# Patient Record
Sex: Male | Born: 1950 | Race: White | Hispanic: No | Smoking: Never smoker
Health system: Southern US, Community
[De-identification: ages and names within clinical notes are randomized; demographics above are authoritative.]

## PROBLEM LIST (undated history)

## (undated) DIAGNOSIS — G709 Myoneural disorder, unspecified: Secondary | ICD-10-CM

## (undated) DIAGNOSIS — E119 Type 2 diabetes mellitus without complications: Secondary | ICD-10-CM

## (undated) DIAGNOSIS — Z87442 Personal history of urinary calculi: Secondary | ICD-10-CM

## (undated) DIAGNOSIS — R350 Frequency of micturition: Secondary | ICD-10-CM

## (undated) DIAGNOSIS — E291 Testicular hypofunction: Secondary | ICD-10-CM

## (undated) DIAGNOSIS — I214 Non-ST elevation (NSTEMI) myocardial infarction: Secondary | ICD-10-CM

## (undated) DIAGNOSIS — N400 Enlarged prostate without lower urinary tract symptoms: Secondary | ICD-10-CM

## (undated) DIAGNOSIS — R300 Dysuria: Secondary | ICD-10-CM

## (undated) DIAGNOSIS — I10 Essential (primary) hypertension: Secondary | ICD-10-CM

## (undated) DIAGNOSIS — I255 Ischemic cardiomyopathy: Secondary | ICD-10-CM

## (undated) DIAGNOSIS — IMO0002 Reserved for concepts with insufficient information to code with codable children: Secondary | ICD-10-CM

## (undated) DIAGNOSIS — E785 Hyperlipidemia, unspecified: Secondary | ICD-10-CM

## (undated) DIAGNOSIS — N419 Inflammatory disease of prostate, unspecified: Secondary | ICD-10-CM

## (undated) DIAGNOSIS — G894 Chronic pain syndrome: Secondary | ICD-10-CM

## (undated) DIAGNOSIS — S4990XA Unspecified injury of shoulder and upper arm, unspecified arm, initial encounter: Secondary | ICD-10-CM

## (undated) DIAGNOSIS — E663 Overweight: Secondary | ICD-10-CM

## (undated) DIAGNOSIS — N1330 Unspecified hydronephrosis: Secondary | ICD-10-CM

## (undated) DIAGNOSIS — G4700 Insomnia, unspecified: Secondary | ICD-10-CM

## (undated) DIAGNOSIS — K5790 Diverticulosis of intestine, part unspecified, without perforation or abscess without bleeding: Secondary | ICD-10-CM

## (undated) DIAGNOSIS — Z8739 Personal history of other diseases of the musculoskeletal system and connective tissue: Secondary | ICD-10-CM

## (undated) DIAGNOSIS — E039 Hypothyroidism, unspecified: Secondary | ICD-10-CM

## (undated) DIAGNOSIS — I77810 Thoracic aortic ectasia: Secondary | ICD-10-CM

## (undated) DIAGNOSIS — Z972 Presence of dental prosthetic device (complete) (partial): Secondary | ICD-10-CM

## (undated) DIAGNOSIS — K289 Gastrojejunal ulcer, unspecified as acute or chronic, without hemorrhage or perforation: Secondary | ICD-10-CM

## (undated) DIAGNOSIS — R351 Nocturia: Secondary | ICD-10-CM

## (undated) DIAGNOSIS — G2581 Restless legs syndrome: Secondary | ICD-10-CM

## (undated) DIAGNOSIS — I2510 Atherosclerotic heart disease of native coronary artery without angina pectoris: Secondary | ICD-10-CM

## (undated) HISTORY — PX: APPENDECTOMY: SHX54

## (undated) HISTORY — DX: Personal history of other diseases of the musculoskeletal system and connective tissue: Z87.39

## (undated) HISTORY — PX: STOMACH SURGERY: SHX791

## (undated) HISTORY — DX: Atherosclerotic heart disease of native coronary artery without angina pectoris: I25.10

## (undated) HISTORY — PX: VASECTOMY: SHX75

## (undated) HISTORY — DX: Hypothyroidism, unspecified: E03.9

## (undated) HISTORY — DX: Testicular hypofunction: E29.1

## (undated) HISTORY — DX: Reserved for concepts with insufficient information to code with codable children: IMO0002

## (undated) HISTORY — DX: Ischemic cardiomyopathy: I25.5

## (undated) HISTORY — DX: Inflammatory disease of prostate, unspecified: N41.9

## (undated) HISTORY — DX: Frequency of micturition: R35.0

## (undated) HISTORY — DX: Dysuria: R30.0

## (undated) HISTORY — DX: Thoracic aortic ectasia: I77.810

## (undated) HISTORY — PX: HEMORRHOID SURGERY: SHX153

## (undated) HISTORY — DX: Essential (primary) hypertension: I10

## (undated) HISTORY — DX: Overweight: E66.3

## (undated) HISTORY — DX: Gastrojejunal ulcer, unspecified as acute or chronic, without hemorrhage or perforation: K28.9

## (undated) HISTORY — PX: LITHOTRIPSY: SUR834

## (undated) HISTORY — PX: NASAL SINUS SURGERY: SHX719

## (undated) HISTORY — DX: Myoneural disorder, unspecified: G70.9

## (undated) HISTORY — DX: Nocturia: R35.1

## (undated) HISTORY — DX: Unspecified hydronephrosis: N13.30

## (undated) HISTORY — DX: Diverticulosis of intestine, part unspecified, without perforation or abscess without bleeding: K57.90

## (undated) HISTORY — DX: Benign prostatic hyperplasia without lower urinary tract symptoms: N40.0

## (undated) HISTORY — DX: Personal history of urinary calculi: Z87.442

## (undated) HISTORY — DX: Hyperlipidemia, unspecified: E78.5

## (undated) HISTORY — DX: Insomnia, unspecified: G47.00

---

## 2004-03-16 ENCOUNTER — Ambulatory Visit: Payer: Self-pay | Admitting: Urology

## 2004-03-23 ENCOUNTER — Ambulatory Visit: Payer: Self-pay | Admitting: Urology

## 2005-03-01 ENCOUNTER — Ambulatory Visit: Payer: Self-pay | Admitting: Urology

## 2005-12-10 ENCOUNTER — Ambulatory Visit: Payer: Self-pay | Admitting: Unknown Physician Specialty

## 2006-11-05 ENCOUNTER — Ambulatory Visit: Payer: Self-pay | Admitting: General Surgery

## 2006-11-05 ENCOUNTER — Other Ambulatory Visit: Payer: Self-pay

## 2006-11-08 ENCOUNTER — Ambulatory Visit: Payer: Self-pay | Admitting: General Surgery

## 2007-08-25 ENCOUNTER — Ambulatory Visit: Payer: Self-pay | Admitting: Unknown Physician Specialty

## 2007-11-10 ENCOUNTER — Ambulatory Visit: Payer: Self-pay | Admitting: Urology

## 2008-08-11 ENCOUNTER — Ambulatory Visit: Payer: Self-pay | Admitting: Unknown Physician Specialty

## 2008-08-18 ENCOUNTER — Ambulatory Visit: Payer: Self-pay | Admitting: Unknown Physician Specialty

## 2008-08-27 ENCOUNTER — Ambulatory Visit: Payer: Self-pay | Admitting: Unknown Physician Specialty

## 2008-12-27 ENCOUNTER — Ambulatory Visit: Payer: Self-pay | Admitting: Pain Medicine

## 2008-12-29 ENCOUNTER — Ambulatory Visit: Payer: Self-pay | Admitting: Unknown Physician Specialty

## 2009-02-02 ENCOUNTER — Ambulatory Visit: Payer: Self-pay | Admitting: Pain Medicine

## 2009-02-03 ENCOUNTER — Ambulatory Visit: Payer: Self-pay | Admitting: Family Medicine

## 2009-02-28 ENCOUNTER — Ambulatory Visit: Payer: Self-pay | Admitting: Pain Medicine

## 2009-03-30 ENCOUNTER — Ambulatory Visit: Payer: Self-pay | Admitting: Pain Medicine

## 2009-06-02 ENCOUNTER — Ambulatory Visit: Payer: Self-pay | Admitting: Physician Assistant

## 2009-08-29 ENCOUNTER — Ambulatory Visit: Payer: Self-pay | Admitting: Pain Medicine

## 2009-11-22 ENCOUNTER — Ambulatory Visit: Payer: Self-pay | Admitting: Urology

## 2011-03-12 DIAGNOSIS — R109 Unspecified abdominal pain: Secondary | ICD-10-CM | POA: Insufficient documentation

## 2011-09-07 DIAGNOSIS — G894 Chronic pain syndrome: Secondary | ICD-10-CM | POA: Insufficient documentation

## 2012-04-09 ENCOUNTER — Ambulatory Visit: Payer: Self-pay | Admitting: Internal Medicine

## 2012-06-04 ENCOUNTER — Emergency Department: Payer: Self-pay | Admitting: Emergency Medicine

## 2012-06-04 LAB — CBC
HCT: 41.7 % (ref 40.0–52.0)
MCH: 29.3 pg (ref 26.0–34.0)
MCV: 88 fL (ref 80–100)
Platelet: 187 10*3/uL (ref 150–440)
RBC: 4.74 10*6/uL (ref 4.40–5.90)
RDW: 14.9 % — ABNORMAL HIGH (ref 11.5–14.5)
WBC: 8.3 10*3/uL (ref 3.8–10.6)

## 2012-06-04 LAB — URINALYSIS, COMPLETE
Ketone: NEGATIVE
Leukocyte Esterase: NEGATIVE
Protein: NEGATIVE
RBC,UR: 12 /HPF (ref 0–5)
Squamous Epithelial: NONE SEEN

## 2012-06-04 LAB — BASIC METABOLIC PANEL
Anion Gap: 9 (ref 7–16)
BUN: 28 mg/dL — ABNORMAL HIGH (ref 7–18)
Chloride: 102 mmol/L (ref 98–107)
Co2: 24 mmol/L (ref 21–32)
Creatinine: 1.84 mg/dL — ABNORMAL HIGH (ref 0.60–1.30)
EGFR (African American): 44 — ABNORMAL LOW
EGFR (Non-African Amer.): 38 — ABNORMAL LOW
Glucose: 105 mg/dL — ABNORMAL HIGH (ref 65–99)
Osmolality: 276 (ref 275–301)
Potassium: 4.3 mmol/L (ref 3.5–5.1)
Sodium: 135 mmol/L — ABNORMAL LOW (ref 136–145)

## 2012-06-09 ENCOUNTER — Ambulatory Visit: Payer: Self-pay

## 2012-06-11 ENCOUNTER — Ambulatory Visit: Payer: Self-pay | Admitting: Urology

## 2012-06-12 ENCOUNTER — Ambulatory Visit: Payer: Self-pay | Admitting: Urology

## 2012-06-24 ENCOUNTER — Ambulatory Visit: Payer: Self-pay

## 2012-07-10 ENCOUNTER — Ambulatory Visit: Payer: Self-pay

## 2013-03-19 ENCOUNTER — Ambulatory Visit: Payer: Self-pay | Admitting: Unknown Physician Specialty

## 2013-03-20 LAB — PATHOLOGY REPORT

## 2013-04-10 DIAGNOSIS — G629 Polyneuropathy, unspecified: Secondary | ICD-10-CM | POA: Insufficient documentation

## 2013-09-09 ENCOUNTER — Encounter: Payer: Self-pay | Admitting: Podiatry

## 2013-09-09 ENCOUNTER — Other Ambulatory Visit: Payer: Self-pay | Admitting: *Deleted

## 2013-09-09 ENCOUNTER — Ambulatory Visit (INDEPENDENT_AMBULATORY_CARE_PROVIDER_SITE_OTHER): Payer: 59 | Admitting: Podiatry

## 2013-09-09 VITALS — BP 120/88 | HR 88 | Resp 16 | Ht 66.0 in | Wt 200.0 lb

## 2013-09-09 DIAGNOSIS — M722 Plantar fascial fibromatosis: Secondary | ICD-10-CM

## 2013-09-09 DIAGNOSIS — L600 Ingrowing nail: Secondary | ICD-10-CM

## 2013-09-09 NOTE — Progress Notes (Signed)
Would like a shot in both heels and have him take a look at my right great toenail i think it is ingrown.  Objective: I have reviewed his past history medications allergies surgeries social history and review of systems. Ulcers remain palpable bilateral. He has pain on palpation medial continued tubercles bilateral. Also sharp incurvated nail margins along the tibial border of the hallux right with a granuloma present similarly to the left hallux left granuloma.  Assessment: Chronic intractable plantar fasciitis bilateral. Idiopathic neuropathy bilateral. Ingrown toenails hallux bilateral with paronychia hallux right.  Plan: Discussed etiology pathology conservative versus surgical therapies. At this point we reinjected his bilateral heels today with Kenalog and local anesthetic. Discussed matrixectomy to the tibial border of the hallux right and he declined. I will followup with him sometime in September for another set of injections before he leaves bone holiday.

## 2013-09-22 IMAGING — CR DG ABDOMEN 1V
1 series · 2 of 2 positions shown · non-contrast
Comparison: none

REASON FOR EXAM: kidney stone
COMMENTS:

[Series 1: supine kub · 0.17mm/px · 2 of 2 slices shown]
[im 1/2]
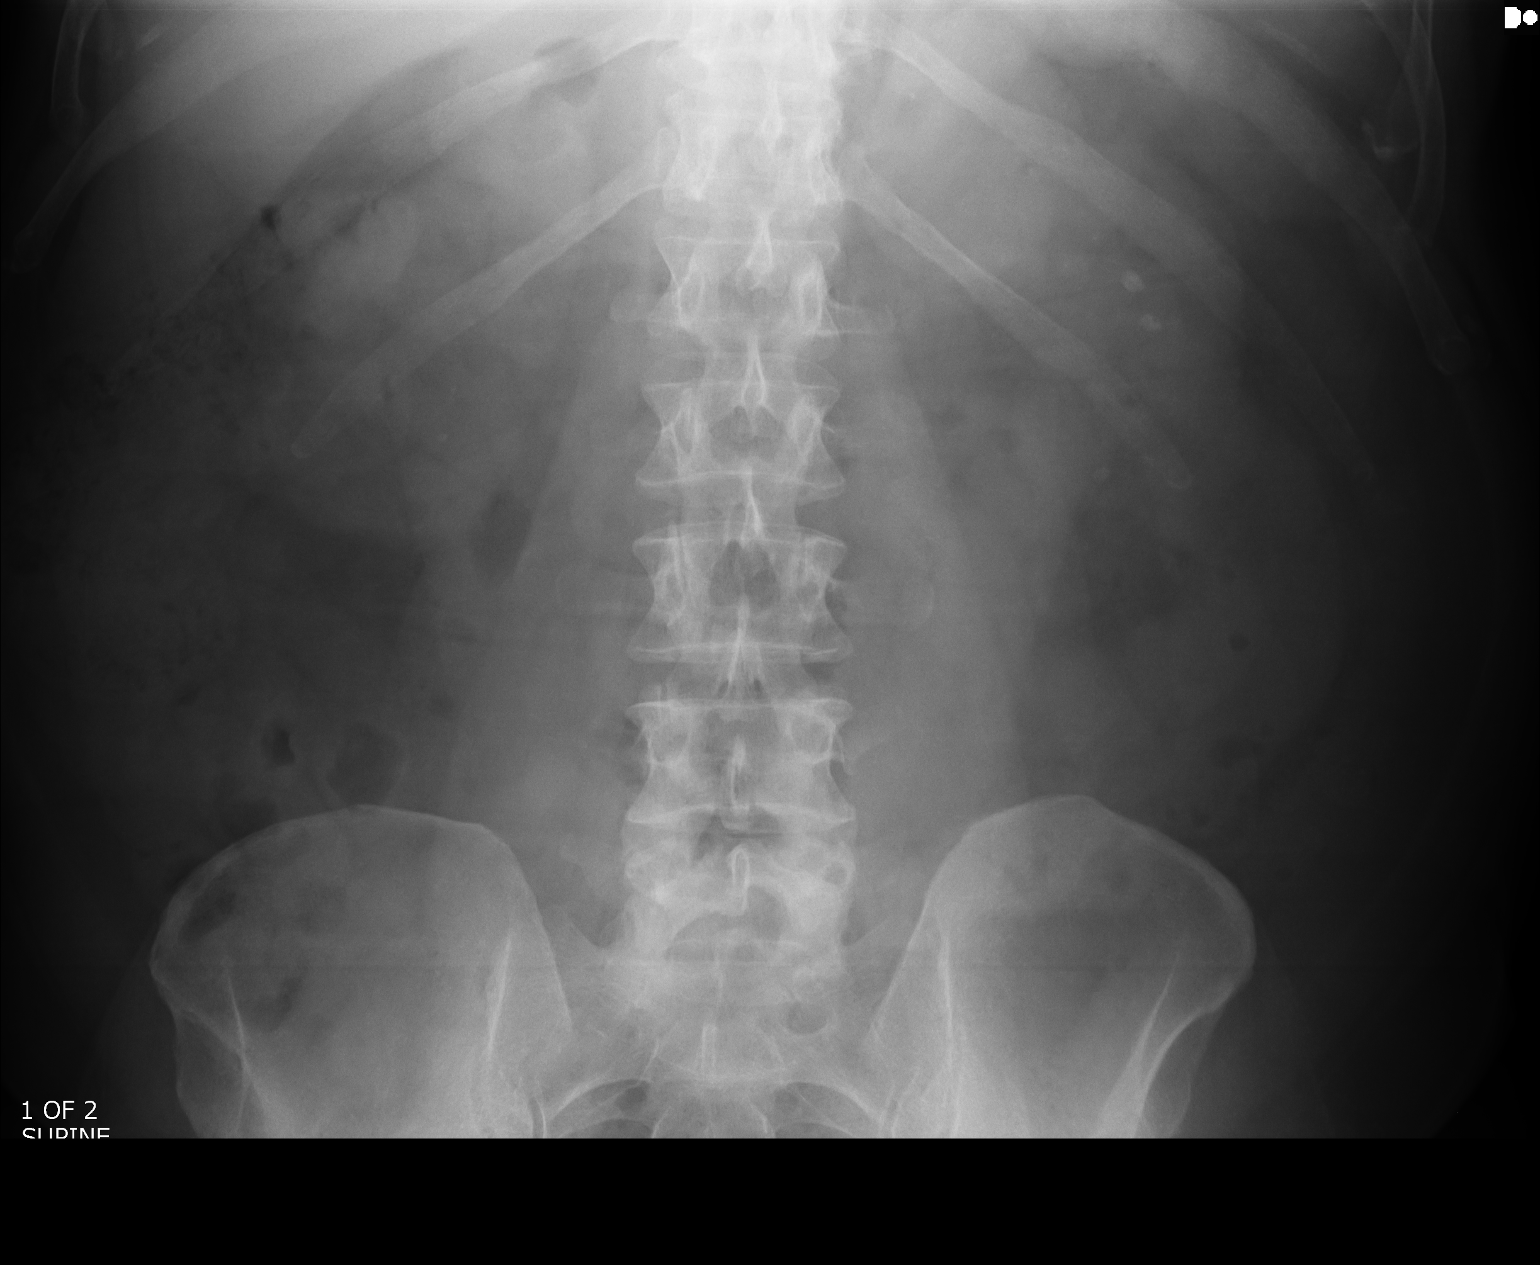
[im 2/2]
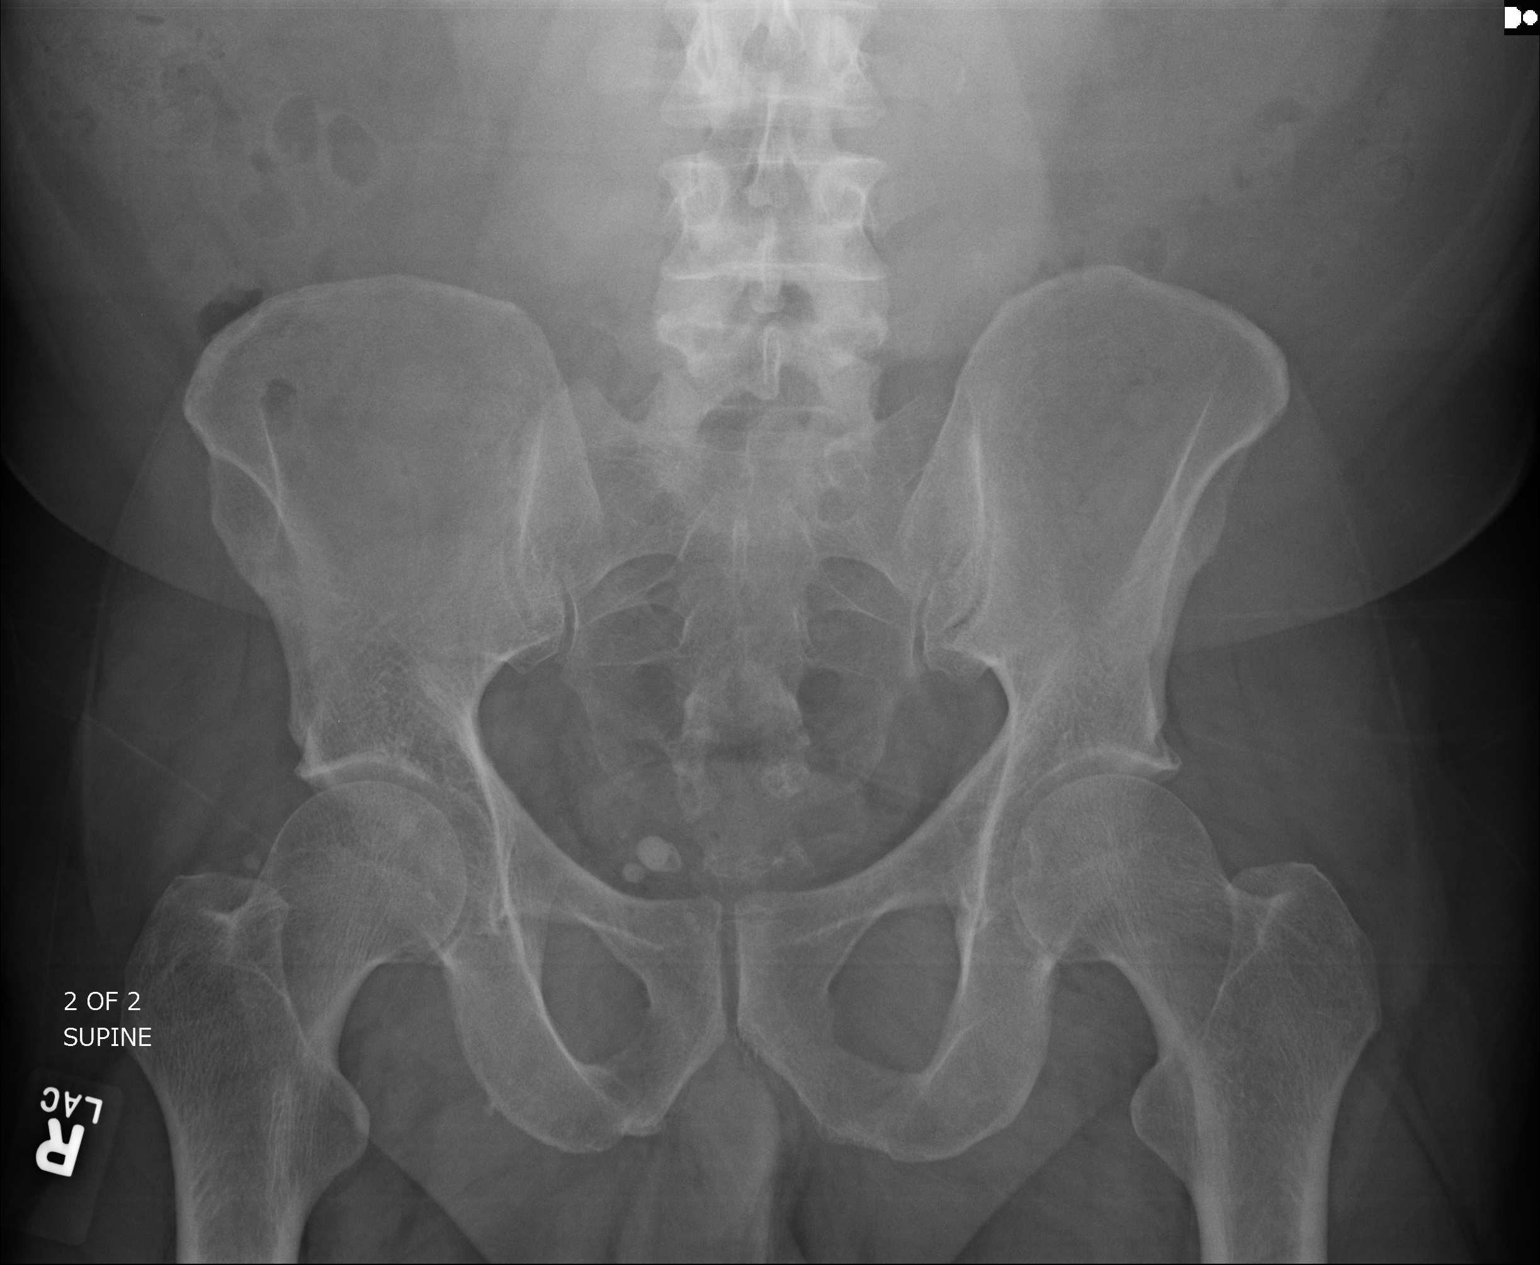

[2 of 2 positions shown; findings below may reference images not displayed]

PROCEDURE:     KDR - KDXR KIDNEY URETER BLADDER  - June 24, 2012  [DATE]

RESULT:     Comparison is made to the study June 09, 2012.

There are calcifications in the mid and lower pole of the left kidney
consistent with stones. At least 3 are visible and appear stable. The
largest measures approximately 6 mm. I do not see definite stones on the
right. Along the expected course of the ureters no stones are evident. There
are phleboliths within the pelvis on the right. Previously demonstrated
distal right ureteral stone is not evident today.
IMPRESSION: I do not see evidence of stones on the right. There are at
least 3 stones in the mid and lower pole collecting systems of the left
kidney.

[REDACTED]

## 2013-10-08 IMAGING — CR DG ABDOMEN 1V
1 series · 2 of 2 positions shown · non-contrast
Comparison: none

REASON FOR EXAM: kidney stone
COMMENTS:

[Series 1: supine kub · 0.17mm/px · 2 of 2 slices shown]
[im 1/2]
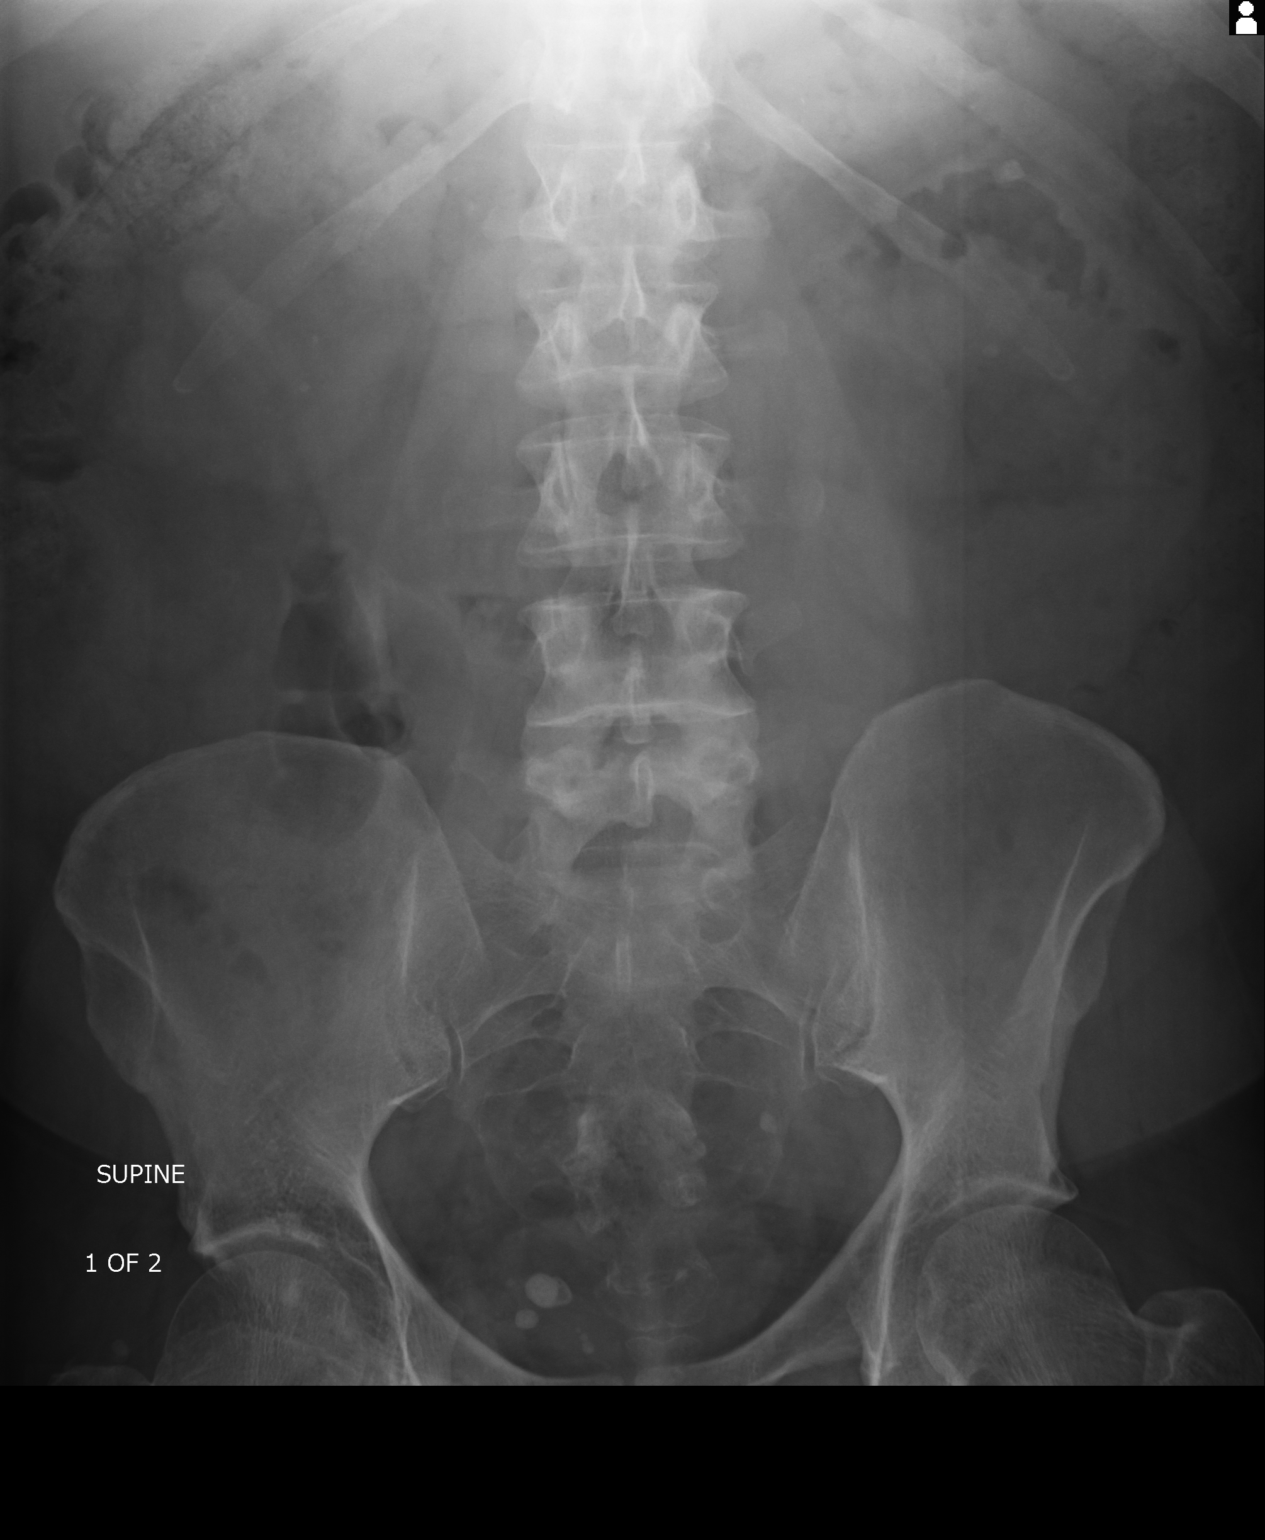
[im 2/2]
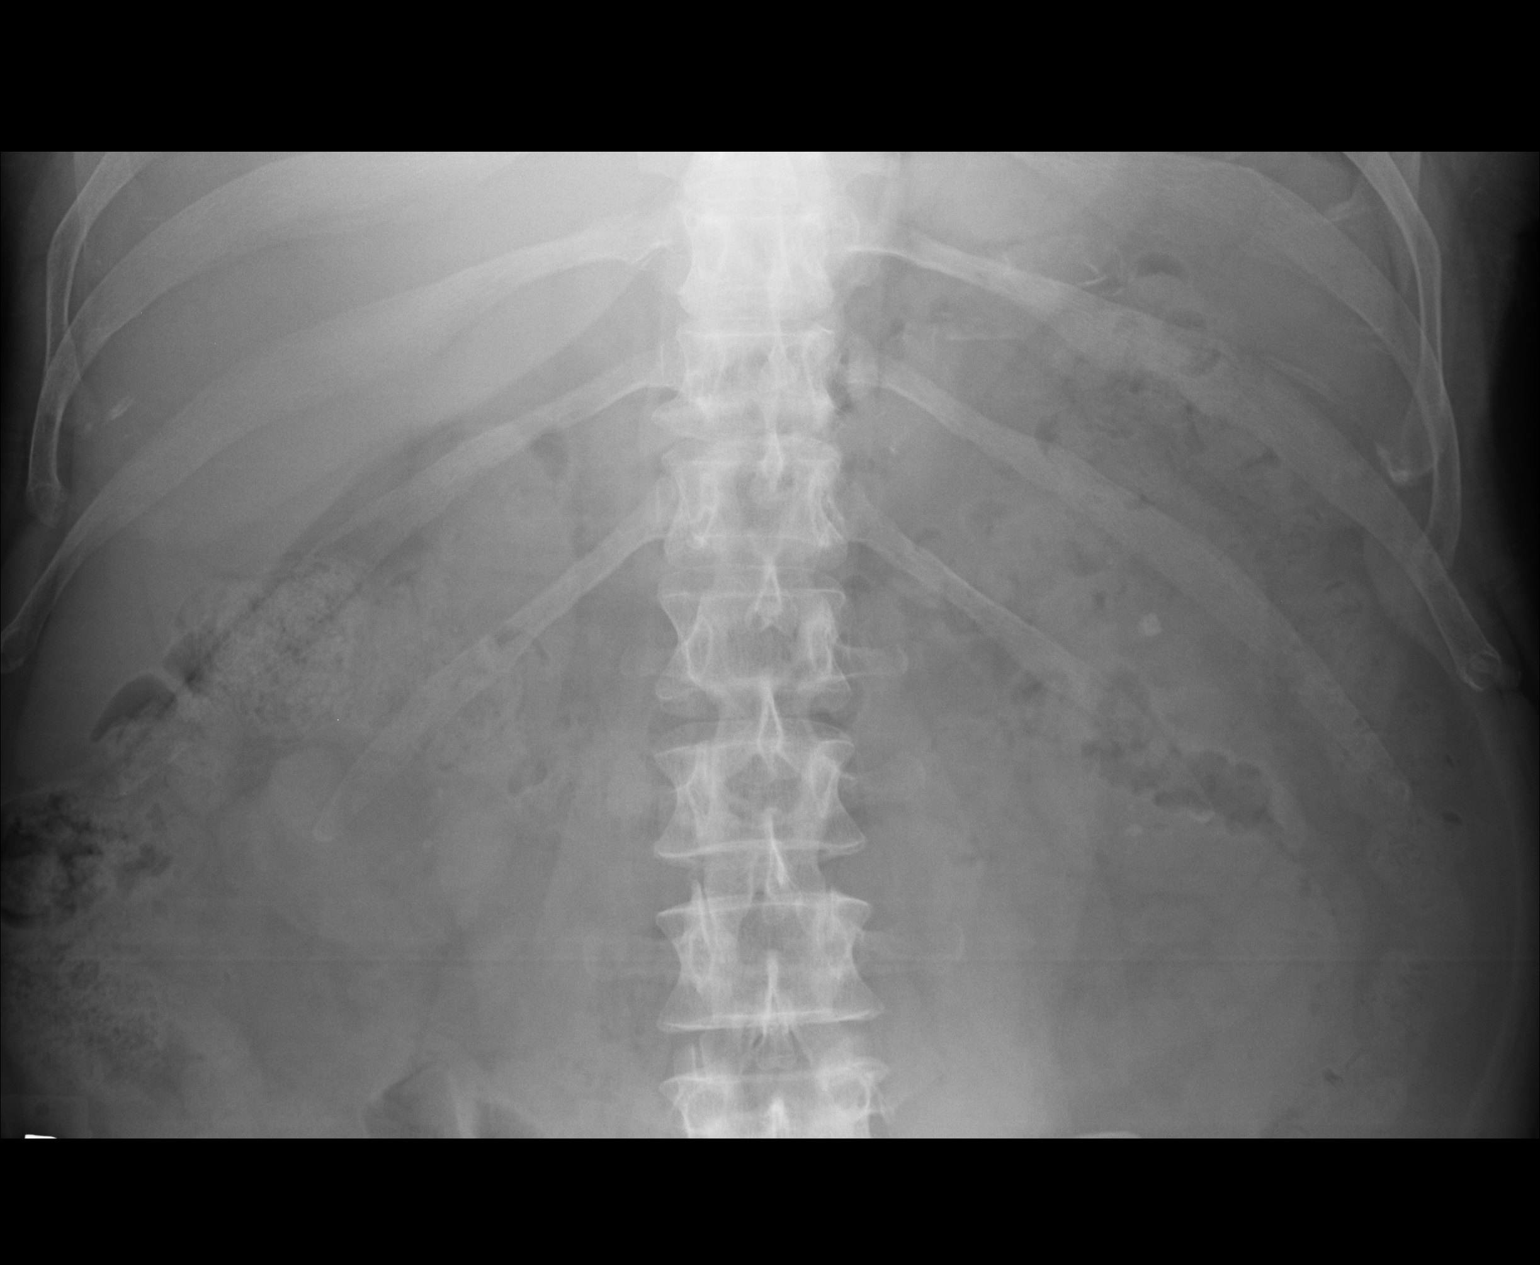

[2 of 2 positions shown; findings below may reference images not displayed]

PROCEDURE:     KDR - KDXR KIDNEY URETER BLADDER  - July 10, 2012  [DATE]

RESULT:     The bowel gas pattern is within the limits of normal. There are
faint calcification projecting over both kidneys. These are similar to that
in appearance those seen previously. There are coarse phleboliths in the
pelvis. A new calcification projecting over the left aspect of the sacrum
may reflect a distal ureteral stone.
IMPRESSION: There are findings consistent with multiple stones in both
kidneys. There is a calcification which projects over the left aspect of the
sacrum which could reflect a distal ureteral stone measuring approximately 4
mm in diameter.

[REDACTED]

## 2013-10-20 DIAGNOSIS — E039 Hypothyroidism, unspecified: Secondary | ICD-10-CM | POA: Insufficient documentation

## 2013-10-20 DIAGNOSIS — E291 Testicular hypofunction: Secondary | ICD-10-CM | POA: Insufficient documentation

## 2013-10-20 DIAGNOSIS — D126 Benign neoplasm of colon, unspecified: Secondary | ICD-10-CM | POA: Insufficient documentation

## 2013-12-10 DIAGNOSIS — K1321 Leukoplakia of oral mucosa, including tongue: Secondary | ICD-10-CM | POA: Diagnosis not present

## 2013-12-10 DIAGNOSIS — K1370 Unspecified lesions of oral mucosa: Secondary | ICD-10-CM | POA: Diagnosis not present

## 2014-01-11 DIAGNOSIS — R109 Unspecified abdominal pain: Secondary | ICD-10-CM | POA: Diagnosis not present

## 2014-01-11 DIAGNOSIS — G894 Chronic pain syndrome: Secondary | ICD-10-CM | POA: Diagnosis not present

## 2014-01-11 DIAGNOSIS — Z79899 Other long term (current) drug therapy: Secondary | ICD-10-CM | POA: Diagnosis not present

## 2014-02-01 ENCOUNTER — Ambulatory Visit (INDEPENDENT_AMBULATORY_CARE_PROVIDER_SITE_OTHER): Payer: Medicare Other | Admitting: Podiatry

## 2014-02-01 VITALS — BP 129/71 | HR 88 | Resp 16

## 2014-02-01 DIAGNOSIS — M722 Plantar fascial fibromatosis: Secondary | ICD-10-CM | POA: Diagnosis not present

## 2014-02-01 NOTE — Progress Notes (Signed)
He presents today with a chief complaint of painful heels bilateral. He states that the last injection seemed to last for approximately 5 months.  Objective: Vital signs are stable he is alert and oriented x3. He has pain on palpation medial continued tubercles bilateral.  Assessment: Pain in limb secondary to plantar fasciitis bilateral.  Plan: Injected bilateral heels bilateral with the local anesthetic. Followup with him as needed.

## 2014-03-14 DIAGNOSIS — Z23 Encounter for immunization: Secondary | ICD-10-CM | POA: Diagnosis not present

## 2014-03-26 DIAGNOSIS — G894 Chronic pain syndrome: Secondary | ICD-10-CM | POA: Diagnosis not present

## 2014-03-26 DIAGNOSIS — R1084 Generalized abdominal pain: Secondary | ICD-10-CM | POA: Diagnosis not present

## 2014-03-26 DIAGNOSIS — G629 Polyneuropathy, unspecified: Secondary | ICD-10-CM | POA: Diagnosis not present

## 2014-03-26 DIAGNOSIS — Z79899 Other long term (current) drug therapy: Secondary | ICD-10-CM | POA: Diagnosis not present

## 2014-04-09 DIAGNOSIS — R351 Nocturia: Secondary | ICD-10-CM | POA: Diagnosis not present

## 2014-04-09 DIAGNOSIS — R350 Frequency of micturition: Secondary | ICD-10-CM | POA: Diagnosis not present

## 2014-04-09 DIAGNOSIS — R3916 Straining to void: Secondary | ICD-10-CM | POA: Diagnosis not present

## 2014-04-09 DIAGNOSIS — N419 Inflammatory disease of prostate, unspecified: Secondary | ICD-10-CM | POA: Diagnosis not present

## 2014-05-17 DIAGNOSIS — R350 Frequency of micturition: Secondary | ICD-10-CM | POA: Diagnosis not present

## 2014-05-17 DIAGNOSIS — R52 Pain, unspecified: Secondary | ICD-10-CM | POA: Diagnosis not present

## 2014-05-17 DIAGNOSIS — N419 Inflammatory disease of prostate, unspecified: Secondary | ICD-10-CM | POA: Diagnosis not present

## 2014-06-18 DIAGNOSIS — E663 Overweight: Secondary | ICD-10-CM | POA: Diagnosis not present

## 2014-06-18 DIAGNOSIS — N419 Inflammatory disease of prostate, unspecified: Secondary | ICD-10-CM | POA: Diagnosis not present

## 2014-06-18 DIAGNOSIS — R350 Frequency of micturition: Secondary | ICD-10-CM | POA: Diagnosis not present

## 2014-06-24 DIAGNOSIS — Z0289 Encounter for other administrative examinations: Secondary | ICD-10-CM | POA: Insufficient documentation

## 2014-06-24 DIAGNOSIS — R1084 Generalized abdominal pain: Secondary | ICD-10-CM | POA: Diagnosis not present

## 2014-06-24 DIAGNOSIS — G629 Polyneuropathy, unspecified: Secondary | ICD-10-CM | POA: Diagnosis not present

## 2014-06-24 DIAGNOSIS — Z79891 Long term (current) use of opiate analgesic: Secondary | ICD-10-CM | POA: Diagnosis not present

## 2014-06-24 DIAGNOSIS — G894 Chronic pain syndrome: Secondary | ICD-10-CM | POA: Diagnosis not present

## 2014-06-24 DIAGNOSIS — Z79899 Other long term (current) drug therapy: Secondary | ICD-10-CM | POA: Diagnosis not present

## 2014-07-26 ENCOUNTER — Ambulatory Visit (INDEPENDENT_AMBULATORY_CARE_PROVIDER_SITE_OTHER): Payer: Medicare Other | Admitting: Podiatry

## 2014-07-26 DIAGNOSIS — M722 Plantar fascial fibromatosis: Secondary | ICD-10-CM | POA: Diagnosis not present

## 2014-07-26 DIAGNOSIS — R350 Frequency of micturition: Secondary | ICD-10-CM | POA: Diagnosis not present

## 2014-07-26 DIAGNOSIS — N419 Inflammatory disease of prostate, unspecified: Secondary | ICD-10-CM | POA: Diagnosis not present

## 2014-07-26 DIAGNOSIS — E669 Obesity, unspecified: Secondary | ICD-10-CM | POA: Diagnosis not present

## 2014-07-26 NOTE — Progress Notes (Signed)
He presents today for follow-up of his plantar fasciitis. He states that he would like to have 2 cortisone injections one for each foot. He states that the pain  Back he should've been in here 3 months ago.  Objective: Vital signs are stable he is alert and oriented 3. He has pain on palpation medial calcaneal tubercles bilateral.  Assessment: Plantar fasciitis chronic in nature bilateral foot.  Plan: Discussed appropriate shoe gear stretching exercises ice therapy and shoe modifications with him. Injected the bilateral heels today with Kenalog and local anesthetic will follow up with him on a when necessary basis.

## 2014-08-23 DIAGNOSIS — E039 Hypothyroidism, unspecified: Secondary | ICD-10-CM | POA: Diagnosis not present

## 2014-08-23 DIAGNOSIS — Z0000 Encounter for general adult medical examination without abnormal findings: Secondary | ICD-10-CM | POA: Diagnosis not present

## 2014-08-23 DIAGNOSIS — L570 Actinic keratosis: Secondary | ICD-10-CM | POA: Diagnosis not present

## 2014-08-23 DIAGNOSIS — I10 Essential (primary) hypertension: Secondary | ICD-10-CM | POA: Diagnosis not present

## 2014-08-23 DIAGNOSIS — K219 Gastro-esophageal reflux disease without esophagitis: Secondary | ICD-10-CM | POA: Diagnosis not present

## 2014-08-24 DIAGNOSIS — E039 Hypothyroidism, unspecified: Secondary | ICD-10-CM | POA: Diagnosis not present

## 2014-08-24 DIAGNOSIS — Z0000 Encounter for general adult medical examination without abnormal findings: Secondary | ICD-10-CM | POA: Diagnosis not present

## 2014-08-24 DIAGNOSIS — I10 Essential (primary) hypertension: Secondary | ICD-10-CM | POA: Diagnosis not present

## 2014-08-27 DIAGNOSIS — N400 Enlarged prostate without lower urinary tract symptoms: Secondary | ICD-10-CM | POA: Diagnosis not present

## 2014-10-05 DIAGNOSIS — G894 Chronic pain syndrome: Secondary | ICD-10-CM | POA: Diagnosis not present

## 2014-10-05 DIAGNOSIS — R1084 Generalized abdominal pain: Secondary | ICD-10-CM | POA: Diagnosis not present

## 2014-10-05 DIAGNOSIS — G629 Polyneuropathy, unspecified: Secondary | ICD-10-CM | POA: Diagnosis not present

## 2014-10-08 DIAGNOSIS — N400 Enlarged prostate without lower urinary tract symptoms: Secondary | ICD-10-CM | POA: Diagnosis not present

## 2014-10-08 DIAGNOSIS — N419 Inflammatory disease of prostate, unspecified: Secondary | ICD-10-CM | POA: Diagnosis not present

## 2014-12-13 ENCOUNTER — Ambulatory Visit (INDEPENDENT_AMBULATORY_CARE_PROVIDER_SITE_OTHER): Payer: Medicare Other | Admitting: Podiatry

## 2014-12-13 VITALS — BP 116/67 | HR 84 | Resp 16

## 2014-12-13 DIAGNOSIS — M722 Plantar fascial fibromatosis: Secondary | ICD-10-CM

## 2014-12-13 NOTE — Progress Notes (Signed)
He presents today with a chief complaint of painful heels bilaterally. He states that he has no change in his gastrointestinal abnormalities nor does he have any change in his legs, restless leg syndrome or heel pain. States that the only thing that seems to help the feet or the injections every few months.  Objective: I'll signs are stable he is alert and oriented 3. Pulses are strongly palpable. He has pain on palpation medial calcaneal tubercles bilateral.  Assessment: Chronic intractable pain to the bilateral lower extremity and gastrointestinal tract plantar fasciitis bilateral.  Plan: Reinjected his bilateral heels today with Kenalog and local and aesthetic. Follow up with him in a few months.

## 2014-12-20 ENCOUNTER — Encounter: Payer: Self-pay | Admitting: *Deleted

## 2014-12-20 ENCOUNTER — Other Ambulatory Visit: Payer: Self-pay | Admitting: Urology

## 2014-12-20 ENCOUNTER — Ambulatory Visit
Admission: RE | Admit: 2014-12-20 | Discharge: 2014-12-20 | Disposition: A | Discharge status: Home / Self Care | Payer: Medicare Other | Source: Ambulatory Visit | Attending: Urology | Admitting: Urology

## 2014-12-20 ENCOUNTER — Ambulatory Visit (INDEPENDENT_AMBULATORY_CARE_PROVIDER_SITE_OTHER): Payer: Medicare Other | Admitting: Urology

## 2014-12-20 VITALS — BP 169/79 | HR 73 | Ht 66.0 in | Wt 210.4 lb

## 2014-12-20 DIAGNOSIS — N2889 Other specified disorders of kidney and ureter: Secondary | ICD-10-CM | POA: Diagnosis not present

## 2014-12-20 DIAGNOSIS — R109 Unspecified abdominal pain: Secondary | ICD-10-CM

## 2014-12-20 DIAGNOSIS — R310 Gross hematuria: Secondary | ICD-10-CM

## 2014-12-20 DIAGNOSIS — R1030 Lower abdominal pain, unspecified: Secondary | ICD-10-CM

## 2014-12-20 DIAGNOSIS — Z87442 Personal history of urinary calculi: Secondary | ICD-10-CM | POA: Insufficient documentation

## 2014-12-20 LAB — URINALYSIS, COMPLETE
Bilirubin, UA: NEGATIVE
GLUCOSE, UA: NEGATIVE
Ketones, UA: NEGATIVE
Leukocytes, UA: NEGATIVE
Nitrite, UA: NEGATIVE
PROTEIN UA: NEGATIVE
RBC UA: NEGATIVE
SPEC GRAV UA: 1.02 (ref 1.005–1.030)
Urobilinogen, Ur: 0.2 mg/dL (ref 0.2–1.0)
pH, UA: 5 (ref 5.0–7.5)

## 2014-12-20 LAB — BLADDER SCAN AMB NON-IMAGING

## 2014-12-20 LAB — MICROSCOPIC EXAMINATION
BACTERIA UA: NONE SEEN
RBC MICROSCOPIC, UA: NONE SEEN /HPF (ref 0–?)

## 2014-12-20 NOTE — Progress Notes (Signed)
12/20/2014 2:53 PM   ZAEEM KANDEL [birth year] [id number]  Referring provider: Maryland Pink, MD [address] [place], [place] [address]  Chief Complaint  Patient presents with  . Nephrolithiasis    HPI: Mr. Mcneese is 64 year old white male who has been experiencing suprapubic pain for ten days that would last for an hour off and one.  Then 3 days ago the pain started to persist throughout the whole day and worsen  the next 2 days.  He is also had episodes of gross hematuria.  He has not had any flank pain, dysuria, fevers, chills, nausea or vomiting.  His UA today was unremarkable.  His PVR is 86 mL.  KUB taken prior to his appointment was suspicious for left renal stones.     He does have a prior history of nephrolithiasis undergoing lithotripsy on 3 occasions.  He also has a history of chronic prostatitis.  He did undergo cystoscopically with Dr. Erlene Quan back in April 2016. After the procedure, they discussed possibly undergoing HOLEP.  He was to return in 6 weeks to discuss that further with Dr. Erlene Quan.   PMH: Past Medical History  Diagnosis Date  . Hypertension   . Ulcer   . Thyroid disease   . Renal stones   . Marginal ulcers   . HLD (hyperlipidemia)   . Insomnia   . Testicular hypofunction   . Diverticulosis   . Hypothyroidism   . Dysuria   . BPH (benign prostatic hyperplasia)   . Prostatitis   . Nocturia   . Over weight   . Hydronephrosis   . Urinary frequency     Surgical History: Past Surgical History  Procedure Laterality Date  . Lithotripsy      x 3  . Stomach surgery    . Hemorrhoid surgery    . Nasal sinus surgery    . Vasectomy    . Appendectomy      Home Medications:    Medication List       This list is accurate as of: 12/20/14  2:53 PM.  Always use your most recent med list.               allopurinol 300 MG tablet  Commonly known as:  ZYLOPRIM     amLODipine-benazepril 10-20 MG per capsule  Commonly known as:  LOTREL     fenofibrate micronized 134 MG capsule  Commonly known as:  LOFIBRA     finasteride 5 MG tablet  Commonly known as:  PROSCAR  Take 5 mg by mouth daily.     fluticasone 50 MCG/ACT nasal spray  Commonly known as:  FLONASE  Place into the nose.     FLUZONE HIGH-DOSE 0.5 ML Susy  Generic drug:  Influenza Vac Split High-Dose  Inject into the muscle.     gabapentin 600 MG tablet  Commonly known as:  NEURONTIN     levothyroxine 125 MCG tablet  Commonly known as:  SYNTHROID, LEVOTHROID     loratadine 10 MG tablet  Commonly known as:  CLARITIN  Take 10 mg by mouth.     nortriptyline 25 MG capsule  Commonly known as:  PAMELOR     omeprazole 20 MG capsule  Commonly known as:  PRILOSEC  Take by mouth.     Oxycodone HCl 10 MG Tabs     pantoprazole 40 MG tablet  Commonly known as:  PROTONIX  Take by mouth.     tamsulosin 0.4 MG Caps capsule  Commonly known as:  FLOMAX     zolpidem 10 MG tablet  Commonly known as:  AMBIEN        Allergies:  Allergies  Allergen Reactions  . Amitriptyline     Reaction unknown  . Duloxetine Hcl     Reaction unkown  . Pollen Extract Other (See Comments)    Sinus issues    Family History: Family History  Problem Relation Age of Onset  . Kidney disease Mother   . Prostate cancer Neg Hx     Social History:  reports that he has never smoked. He has never used smokeless tobacco. He reports that he does not drink alcohol or use illicit drugs.  ROS: UROLOGY Frequent Urination?: Yes Hard to postpone urination?: Yes Burning/pain with urination?: No Get up at night to urinate?: Yes Leakage of urine?: No Urine stream starts and stops?: Yes Trouble starting stream?: No Do you have to strain to urinate?: Yes Blood in urine?: Yes Urinary tract infection?: No Sexually transmitted disease?: No Injury to kidneys or bladder?: No Painful intercourse?: No Weak stream?: Yes Erection problems?: Yes Penile pain?:  No  Gastrointestinal Nausea?: Yes Vomiting?: No Indigestion/heartburn?: No Diarrhea?: Yes Constipation?: Yes  Constitutional Fever: No Night sweats?: No Weight loss?: No Fatigue?: No  Skin Skin rash/lesions?: No Itching?: No  Eyes Blurred vision?: No Double vision?: No  Ears/Nose/Throat Sore throat?: No Sinus problems?: No  Hematologic/Lymphatic Swollen glands?: No Easy bruising?: No  Cardiovascular Leg swelling?: No Chest pain?: No  Respiratory Cough?: No Shortness of breath?: No  Endocrine Excessive thirst?: No  Musculoskeletal Back pain?: No Joint pain?: No  Neurological Headaches?: No Dizziness?: No  Psychologic Depression?: No Anxiety?: No  Physical Exam: BP 169/79 mmHg  Pulse 73  Ht 5\' 6"  (1.676 m)  Wt 210 lb 6.4 oz (95.437 kg)  BMI 33.98 kg/m2  GU: Patient with circumcised phallus.  Urethral meatus is patent.  No penile discharge. No penile lesions or rashes. Scrotum without lesions, cysts, rashes and/or edema.  Testicles are located scrotally bilaterally. No masses are appreciated in the testicles. Left and right epididymis are normal. Rectal: Patient with  normal sphincter tone. Perineum without scarring or rashes. No rectal masses are appreciated. Prostate is approximately 50 grams (could not palpated entire gland due to buttocks tissue), no nodules are appreciated. Seminal vesicles are normal.  Laboratory Data: Results for orders placed or performed in visit on 12/20/14  Microscopic Examination  Result Value Ref Range   WBC, UA 0-5 0 -  5 /hpf   RBC, UA None seen 0 -  2 /hpf   Epithelial Cells (non renal) 0-10 0 - 10 /hpf   Bacteria, UA None seen None seen/Few  Urinalysis, Complete  Result Value Ref Range   Specific Gravity, UA 1.020 1.005 - 1.030   pH, UA 5.0 5.0 - 7.5   Color, UA Yellow Yellow   Appearance Ur Clear Clear   Leukocytes, UA Negative Negative   Protein, UA Negative Negative/Trace   Glucose, UA Negative Negative    Ketones, UA Negative Negative   RBC, UA Negative Negative   Bilirubin, UA Negative Negative   Urobilinogen, Ur 0.2 0.2 - 1.0 mg/dL   Nitrite, UA Negative Negative   Microscopic Examination See below:   BLADDER SCAN AMB NON-IMAGING  Result Value Ref Range   Scan Result 54ml    Lab Results  Component Value Date   WBC 8.3 06/04/2012   HGB 13.9 06/04/2012   HCT 41.7 06/04/2012  MCV 88 06/04/2012   PLT 187 06/04/2012    Lab Results  Component Value Date   CREATININE 1.84* 06/04/2012    No results found for: PSA  No results found for: TESTOSTERONE  No results found for: HGBA1C  Urinalysis    Component Value Date/Time   COLORURINE Yellow 06/04/2012 1954   APPEARANCEUR Clear 06/04/2012 1954   LABSPEC 1.015 06/04/2012 1954   PHURINE 5.0 06/04/2012 1954   GLUCOSEU Negative 06/04/2012 1954   HGBUR 2+ 06/04/2012 [place] Negative 06/04/2012 [place] Negative 06/04/2012 1954   PROTEINUR Negative 06/04/2012 1954   NITRITE Negative 06/04/2012 1954   LEUKOCYTESUR Negative 06/04/2012 1954    Pertinent Imaging:   Assessment & Plan:    1. Gross hematuria:   Explained to patient the causes of blood in the urine are as follows: stones, BPH, UTI's, damage to the urinary tract and/or cancer.  It is explained to the patient that they will be scheduled for a CT Urogram with contrast material and that in rare instances, an allergic reaction can be serious and even life threatening with the injection of contrast material.   The patient denies any allergies to contrast, iodine and/or seafood and is not taking metformin.  The voices his understanding and wishes to proceed with the CT Urogram  - Urinalysis, Complete  2. Suprapubic pain:   UA is negative for infection.  PVR is not impressive.  Patient will be scheduled for a CT Urogram for further evaluation.   No Follow-up on file.  Zara Council, [facility] [address]  [place], [place] [address] [phone]

## 2014-12-21 ENCOUNTER — Ambulatory Visit
Admission: RE | Admit: 2014-12-21 | Discharge: 2014-12-21 | Disposition: A | Discharge status: Home / Self Care | Payer: Medicare Other | Source: Ambulatory Visit | Attending: Urology | Admitting: Urology

## 2014-12-21 DIAGNOSIS — K760 Fatty (change of) liver, not elsewhere classified: Secondary | ICD-10-CM | POA: Insufficient documentation

## 2014-12-21 DIAGNOSIS — R310 Gross hematuria: Secondary | ICD-10-CM | POA: Diagnosis present

## 2014-12-21 DIAGNOSIS — N281 Cyst of kidney, acquired: Secondary | ICD-10-CM | POA: Diagnosis not present

## 2014-12-21 DIAGNOSIS — K5730 Diverticulosis of large intestine without perforation or abscess without bleeding: Secondary | ICD-10-CM | POA: Diagnosis not present

## 2014-12-21 DIAGNOSIS — N200 Calculus of kidney: Secondary | ICD-10-CM | POA: Diagnosis not present

## 2014-12-21 DIAGNOSIS — R102 Pelvic and perineal pain: Secondary | ICD-10-CM | POA: Insufficient documentation

## 2014-12-21 MED ORDER — IOHEXOL 300 MG/ML  SOLN
125.0000 mL | Freq: Once | INTRAMUSCULAR | Status: AC | PRN
  Administered 2014-12-21: 100 mL via INTRAVENOUS

## 2014-12-23 ENCOUNTER — Encounter: Payer: Self-pay | Admitting: Urology

## 2014-12-23 ENCOUNTER — Ambulatory Visit (INDEPENDENT_AMBULATORY_CARE_PROVIDER_SITE_OTHER): Payer: Medicare Other | Admitting: Urology

## 2014-12-23 VITALS — BP 118/82 | HR 74 | Ht 66.0 in | Wt 207.1 lb

## 2014-12-23 DIAGNOSIS — R1030 Lower abdominal pain, unspecified: Secondary | ICD-10-CM | POA: Diagnosis not present

## 2014-12-23 DIAGNOSIS — R310 Gross hematuria: Secondary | ICD-10-CM | POA: Diagnosis not present

## 2014-12-23 LAB — URINALYSIS, COMPLETE
Bilirubin, UA: NEGATIVE
GLUCOSE, UA: NEGATIVE
Ketones, UA: NEGATIVE
Leukocytes, UA: NEGATIVE
Nitrite, UA: NEGATIVE
PH UA: 5.5 (ref 5.0–7.5)
PROTEIN UA: NEGATIVE
RBC, UA: NEGATIVE
SPEC GRAV UA: 1.015 (ref 1.005–1.030)
UUROB: 0.2 mg/dL (ref 0.2–1.0)

## 2014-12-23 LAB — MICROSCOPIC EXAMINATION: RBC MICROSCOPIC, UA: NONE SEEN /HPF (ref 0–?)

## 2014-12-23 MED ORDER — CIPROFLOXACIN HCL 500 MG PO TABS: 500.0000 mg | ORAL_TABLET | Freq: Two times a day (BID) | ORAL | Status: DC

## 2014-12-23 NOTE — Progress Notes (Signed)
Troy Rojas 28-Mar-1951 [id number]  Referring provider: Maryland Pink, MD [address] [place], [place] [address]  Chief Complaint  Patient presents with  . Follow-up    CT results    HPI: Troy Rojas is a 64 year old white male who underwent a CT Urogram on 12/21/2014 for gross hematuria and suprapubic pain.   Previous history: Troy Rojas is 64 year old white male who has been experiencing suprapubic pain for ten days that would last for an hour off and one.  Then 3 days ago the pain started to persist throughout the whole day and worsen  the next 2 days.  He is also had episodes of gross hematuria.  He has not had any flank pain, dysuria, fevers, chills, nausea or vomiting.  His UA today was unremarkable.  His PVR is 86 mL.  KUB taken prior to his appointment was suspicious for left renal stones.     He does have a prior history of nephrolithiasis undergoing lithotripsy on 3 occasions.  He also has a history of chronic prostatitis.  He did undergo cystoscopically with Dr. Erlene Quan back in April 2016. After the procedure, they discussed possibly undergoing HOLEP.  He was to return in 6 weeks to discuss that further with Dr. Erlene Quan.  Today, he is still experiencing the blood in his underwear and suprapubic pain.  It has not changed in frequency or intensity since he was seen two days ago.  I have reviewed the CT Urogram with the patient.  It noted an increased stone burden bilaterally when compared with the CT in 2014, bilateral renal cysts but no ureteral of bladder calculus.  The left ureter was not completely opacified on today's exam.  PMH: Past Medical History  Diagnosis Date  . Hypertension   . Ulcer   . Thyroid disease   . Renal stones   . Marginal ulcers   . HLD (hyperlipidemia)   . Insomnia   . Testicular hypofunction   . Diverticulosis   . Hypothyroidism   . Dysuria   . BPH (benign prostatic hyperplasia)   . Prostatitis   . Nocturia   . Over  weight   . Hydronephrosis   . Urinary frequency     Surgical History: Past Surgical History  Procedure Laterality Date  . Lithotripsy      x 3  . Stomach surgery    . Hemorrhoid surgery    . Nasal sinus surgery    . Vasectomy    . Appendectomy      Home Medications:    Medication List       This list is accurate as of: 12/23/14 11:22 PM.  Always use your most recent med list.               allopurinol 300 MG tablet  Commonly known as:  ZYLOPRIM     amLODipine-benazepril 10-20 MG per capsule  Commonly known as:  LOTREL     ciprofloxacin 500 MG tablet  Commonly known as:  CIPRO  Take 1 tablet (500 mg total) by mouth every 12 (twelve) hours.     fenofibrate micronized 134 MG capsule  Commonly known as:  LOFIBRA     finasteride 5 MG tablet  Commonly known as:  PROSCAR  Take 5 mg by mouth daily.     fluticasone 50 MCG/ACT nasal spray  Commonly known as:  FLONASE  Place into the nose.     gabapentin 600 MG  tablet  Commonly known as:  NEURONTIN     levothyroxine 125 MCG tablet  Commonly known as:  SYNTHROID, LEVOTHROID     loratadine 10 MG tablet  Commonly known as:  CLARITIN  Take 10 mg by mouth.     nortriptyline 25 MG capsule  Commonly known as:  PAMELOR     Oxycodone HCl 10 MG Tabs     pantoprazole 40 MG tablet  Commonly known as:  PROTONIX  Take by mouth.     tamsulosin 0.4 MG Caps capsule  Commonly known as:  FLOMAX     zolpidem 10 MG tablet  Commonly known as:  AMBIEN        Allergies:  Allergies  Allergen Reactions  . Amitriptyline     Reaction unknown  . Duloxetine Hcl     Reaction unkown  . Pollen Extract Other (See Comments)    Sinus issues    Family History: Family History  Problem Relation Age of Onset  . Kidney disease Mother   . Prostate cancer Neg Hx     Social History:  reports that he has never smoked. He has never used smokeless tobacco. He reports that he does not drink alcohol or use illicit  drugs.  ROS: UROLOGY Frequent Urination?: Yes Hard to postpone urination?: Yes Burning/pain with urination?: No Get up at night to urinate?: Yes Leakage of urine?: No Urine stream starts and stops?: Yes Trouble starting stream?: No Do you have to strain to urinate?: Yes Blood in urine?: Yes Urinary tract infection?: No Sexually transmitted disease?: No Injury to kidneys or bladder?: No Painful intercourse?: No Weak stream?: Yes Erection problems?: Yes Penile pain?: No  Gastrointestinal Nausea?: Yes Vomiting?: No Indigestion/heartburn?: No Diarrhea?: Yes Constipation?: Yes  Constitutional Fever: No Night sweats?: No Weight loss?: No Fatigue?: No  Skin Skin rash/lesions?: No Itching?: No  Eyes Blurred vision?: No Double vision?: No  Ears/Nose/Throat Sore throat?: No Sinus problems?: No  Hematologic/Lymphatic Swollen glands?: No Easy bruising?: No  Cardiovascular Leg swelling?: No Chest pain?: No  Respiratory Cough?: No Shortness of breath?: No  Endocrine Excessive thirst?: No  Musculoskeletal Back pain?: No Joint pain?: No  Neurological Headaches?: No Dizziness?: No  Psychologic Depression?: No Anxiety?: No  Physical Exam: BP 118/82 mmHg  Pulse 74  Ht 5\' 6"  (1.676 m)  Wt 207 lb 1.6 oz (93.94 kg)  BMI 33.44 kg/m2   Laboratory Data: Results for orders placed or performed in visit on 12/23/14  Microscopic Examination  Result Value Ref Range   WBC, UA 0-5 0 -  5 /hpf   RBC, UA None seen 0 -  2 /hpf   Epithelial Cells (non renal) 0-10 0 - 10 /hpf   Bacteria, UA Few None seen/Few  Urinalysis, Complete  Result Value Ref Range   Specific Gravity, UA 1.015 1.005 - 1.030   pH, UA 5.5 5.0 - 7.5   Color, UA Yellow Yellow   Appearance Ur Clear Clear   Leukocytes, UA Negative Negative   Protein, UA Negative Negative/Trace   Glucose, UA Negative Negative   Ketones, UA Negative Negative   RBC, UA Negative Negative   Bilirubin, UA  Negative Negative   Urobilinogen, Ur 0.2 0.2 - 1.0 mg/dL   Nitrite, UA Negative Negative   Microscopic Examination See below:    Lab Results  Component Value Date   WBC 8.3 06/04/2012   HGB 13.9 06/04/2012   HCT 41.7 06/04/2012   MCV 88 06/04/2012   PLT 187 06/04/2012  Lab Results  Component Value Date   CREATININE 1.84* 06/04/2012    No results found for: PSA  No results found for: TESTOSTERONE  No results found for: HGBA1C  Urinalysis    Component Value Date/Time   COLORURINE Yellow 06/04/2012 1954   APPEARANCEUR Clear 06/04/2012 1954   LABSPEC 1.015 06/04/2012 1954   PHURINE 5.0 06/04/2012 1954   GLUCOSEU Negative 12/23/2014 1503   GLUCOSEU Negative 06/04/2012 1954   HGBUR 2+ 06/04/2012 1954   BILIRUBINUR Negative 12/23/2014 [place] Negative 06/04/2012 [place] Negative 06/04/2012 1954   PROTEINUR Negative 06/04/2012 1954   NITRITE Negative 12/23/2014 1503   NITRITE Negative 06/04/2012 1954   LEUKOCYTESUR Negative 12/23/2014 1503   LEUKOCYTESUR Negative 06/04/2012 1954    Pertinent Imaging: CLINICAL DATA: Abdominal and bilateral groin pain with gross hematuria earlier this week. History of kidney stones with lithotripsy. Previous appendectomy. Initial encounter.  EXAM: CT ABDOMEN AND PELVIS WITHOUT AND WITH CONTRAST  TECHNIQUE: Multidetector CT imaging of the abdomen and pelvis was performed following the standard protocol before and following the bolus administration of intravenous contrast.  CONTRAST: 120mL OMNIPAQUE IOHEXOL 300 MG/ML SOLN  COMPARISON: Abdominal pelvic CT 06/11/2012.  FINDINGS: Lower chest: Clear lung bases. No significant pleural or pericardial effusion.  Hepatobiliary: There is diffuse hepatic steatosis. No focal lesions or abnormal enhancement demonstrated following contrast. No evidence of gallstones, gallbladder wall thickening or biliary dilatation.  Pancreas: Unremarkable. No pancreatic  ductal dilatation or surrounding inflammatory changes.  Spleen: Normal in size without focal abnormality.  Adrenals/Urinary Tract: Both adrenal glands appear normal.Bilateral nephrolithiasis is again noted with interval increase in stone burden. The largest calculus is in the interpolar region of the left kidney, measuring 9 mm in diameter. No evidence of recurrent hydronephrosis or ureteral calculus. Bilateral renal cysts are similar to the prior examination, measuring up to 6.7 cm in the upper pole the left kidney. No evidence of enhancing renal mass. Delayed post-contrast images result in segmental visualization of the ureters. No urothelial lesion identified. There is hypertrophy of the median lobe of the prostate gland which protrudes into the bladder base. No bladder lesion identified.  Stomach/Bowel: Moderate diverticular changes are again noted throughout the sigmoid colon without surrounding inflammation. There is a small hiatal hernia. No evidence of bowel obstruction or extraluminal fluid collection.  Vascular/Lymphatic: There are no enlarged abdominal or pelvic lymph nodes. Mild aortoiliac atherosclerosis.  Reproductive: Stable mild heterogeneous enlargement of the prostate gland. Pelvic phleboliths asymmetric to the right are stable.  Other: No evidence of abdominal wall mass or hernia.  Musculoskeletal: No acute or significant osseous findings.  IMPRESSION: 1. Bilateral nephrolithiasis with increased stone burden compared with prior CT from 2014. No evidence of recurrent ureteral calculus or hydronephrosis. 2. No evidence of renal or urothelial mass. Bilateral renal cysts are grossly stable. 3. Diffuse hepatic steatosis. 4. Moderate sigmoid diverticulosis.   Electronically Signed  By: Richardean Sale M.D.  On: 12/21/2014 16:27   Assessment & Plan:    1. Gross hematuria:   CT Urogram completed on 12/21/2014 noted bilateral nephrolithiasis,  bilateral renal cysts and poor opacification of the left ureter.  It would be advisable for the patient to undergo cystoscopy with bilateral retrogrades, but the patient is also being monitored for a HOLEP procedure.    If it is possible, he would like to have both procedures completed at the same time.  He did undergo a cystoscopic examination in April 2016 and no bladder tumors were seen  at that time.  Cipro provided relief for him last fall when he was experiencing similar symptoms.  I will prescribe the Cipro and have the patient return in one month for symptom recheck and PVR.    When Dr. Erlene Quan returns from maternity leave, we will address the HOLEP and retrogrades and possible URS/LL/stent placement at that time.   - Urinalysis, Complete  2. Suprapubic pain:   UA is negative for infection.  PVR is not impressive.  Patient will be prescribed Cipro for one month.  He will return in one month for a PVR, UA and office visit.   Return in about 1 month (around 01/23/2015) for UA, PVR and office visit.  Zara Council, [facility] [address] [place], [place] [address] [phone]

## 2014-12-25 LAB — CULTURE, URINE COMPREHENSIVE

## 2015-01-06 DIAGNOSIS — G894 Chronic pain syndrome: Secondary | ICD-10-CM | POA: Diagnosis not present

## 2015-01-18 ENCOUNTER — Other Ambulatory Visit: Payer: Self-pay | Admitting: Urology

## 2015-01-24 ENCOUNTER — Other Ambulatory Visit: Payer: Self-pay | Admitting: Urology

## 2015-01-25 ENCOUNTER — Ambulatory Visit (INDEPENDENT_AMBULATORY_CARE_PROVIDER_SITE_OTHER): Payer: Medicare Other | Admitting: Urology

## 2015-01-25 ENCOUNTER — Encounter: Payer: Self-pay | Admitting: Urology

## 2015-01-25 VITALS — BP 115/80 | HR 80 | Ht 66.0 in | Wt 206.4 lb

## 2015-01-25 DIAGNOSIS — R310 Gross hematuria: Secondary | ICD-10-CM

## 2015-01-25 DIAGNOSIS — R1030 Lower abdominal pain, unspecified: Secondary | ICD-10-CM

## 2015-01-25 LAB — BLADDER SCAN AMB NON-IMAGING: Scan Result: 40

## 2015-01-25 MED ORDER — FINASTERIDE 5 MG PO TABS: 5.0000 mg | ORAL_TABLET | Freq: Every day | ORAL | Status: AC

## 2015-01-25 NOTE — Progress Notes (Signed)
01/25/2015 1:18 PM   Troy Rojas 05-26-1950 [id number]  Referring provider: Maryland Pink, MD [address] [place], [place] [address]  Chief Complaint  Patient presents with  . Hematuria    recheck    HPI: Patient is a 64 year old white male who presents today after a 30 day course of Cipro for prostatitis to discuss his response the medication and to see if his hematuria has resolved.   Previous history: Troy Rojas is 64 year old white male who had been experiencing suprapubic pain for ten days that would last for an hour off and one. It then started to persist throughout the whole day and worsen the next 2 days. He is also had episodes of gross hematuria. He has not had any flank pain, dysuria, fevers, chills, nausea or vomiting. His UA today was unremarkable. His PVR was 86 mL on 12/20/2014. KUB taken on 12/20/2014 was suspicious for left renal stones.He does have a prior history of nephrolithiasis undergoing lithotripsy on 3 occasions. He also has a history of chronic prostatitis.  He did undergo a cystoscopic exam with Dr. Erlene Quan in April 2016. After the procedure, they discussed possibly undergoing HOLEP. He was to return in 6 weeks to discuss that further with Dr. Erlene Quan.  At his last visit, I reviewed the CT Urogram completed on 12/21/2014 with the patient. It noted an increased stone burden bilaterally when compared with the CT in 2014, bilateral renal cysts but no ureteral of bladder calculus. The left ureter was not completely opacified on the CT.    Today, he is not having any more suprapubic pain.  He is fearful that when he completes his Cipro, his pain will return.  He realizes that he cannot take Cipro long term as it is not a safe antibiotic.  He is not having any gross hematuria or flank pain.  He also denies any dysuria, fevers, chills, nausea or vomiting.    PMH: Past Medical History  Diagnosis Date  . Hypertension   . Ulcer   . Thyroid disease   .  Renal stones   . Marginal ulcers   . HLD (hyperlipidemia)   . Insomnia   . Testicular hypofunction   . Diverticulosis   . Hypothyroidism   . Dysuria   . BPH (benign prostatic hyperplasia)   . Prostatitis   . Nocturia   . Over weight   . Hydronephrosis   . Urinary frequency     Surgical History: Past Surgical History  Procedure Laterality Date  . Lithotripsy      x 3  . Stomach surgery    . Hemorrhoid surgery    . Nasal sinus surgery    . Vasectomy    . Appendectomy      Home Medications:    Medication List       This list is accurate as of: 01/25/15 11:59 PM.  Always use your most recent med list.               allopurinol 300 MG tablet  Commonly known as:  ZYLOPRIM     amLODipine-benazepril 10-20 MG per capsule  Commonly known as:  LOTREL     ciprofloxacin 500 MG tablet  Commonly known as:  CIPRO  Take 1 tablet (500 mg total) by mouth every 12 (twelve) hours.     fenofibrate micronized 134 MG capsule  Commonly known as:  LOFIBRA     finasteride 5 MG tablet  Commonly known as:  PROSCAR  Take 1 tablet (5 mg total) by mouth daily.     fluticasone 50 MCG/ACT nasal spray  Commonly known as:  FLONASE  Place into the nose.     gabapentin 600 MG tablet  Commonly known as:  NEURONTIN     levothyroxine 125 MCG tablet  Commonly known as:  SYNTHROID, LEVOTHROID     loratadine 10 MG tablet  Commonly known as:  CLARITIN  Take 10 mg by mouth.     nortriptyline 25 MG capsule  Commonly known as:  PAMELOR     Oxycodone HCl 10 MG Tabs     pantoprazole 40 MG tablet  Commonly known as:  PROTONIX  Take by mouth.     tamsulosin 0.4 MG Caps capsule  Commonly known as:  FLOMAX     zolpidem 10 MG tablet  Commonly known as:  AMBIEN        Allergies:  Allergies  Allergen Reactions  . Amitriptyline     Reaction unknown  . Duloxetine Hcl     Reaction unkown  . Pollen Extract Other (See Comments)    Sinus issues    Family History: Family History    Problem Relation Age of Onset  . Kidney disease Mother   . Prostate cancer Neg Hx   . Diabetes type II Mother     Social History:  reports that he has never smoked. He has never used smokeless tobacco. He reports that he does not drink alcohol or use illicit drugs.  ROS: UROLOGY Frequent Urination?: Yes Hard to postpone urination?: No Burning/pain with urination?: No Get up at night to urinate?: Yes Leakage of urine?: No Urine stream starts and stops?: Yes Trouble starting stream?: Yes Do you have to strain to urinate?: Yes Blood in urine?: No Urinary tract infection?: No Sexually transmitted disease?: No Injury to kidneys or bladder?: No Painful intercourse?: No Weak stream?: Yes Erection problems?: No Penile pain?: No  Gastrointestinal Nausea?: No Vomiting?: No Indigestion/heartburn?: No Diarrhea?: No Constipation?: Yes  Constitutional Fever: No Night sweats?: No Weight loss?: No Fatigue?: Yes  Skin Skin rash/lesions?: No Itching?: No  Eyes Blurred vision?: No Double vision?: No  Ears/Nose/Throat Sore throat?: No Sinus problems?: Yes  Hematologic/Lymphatic Swollen glands?: No Easy bruising?: No  Cardiovascular Leg swelling?: No Chest pain?: No  Respiratory Cough?: No Shortness of breath?: No  Endocrine Excessive thirst?: No  Musculoskeletal Back pain?: No Joint pain?: No  Neurological Headaches?: No Dizziness?: No  Psychologic Depression?: No Anxiety?: No  Physical Exam: BP 115/80 mmHg  Pulse 80  Ht 5\' 6"  (1.676 m)  Wt 206 lb 6.4 oz (93.622 kg)  BMI 33.33 kg/m2    Laboratory Data: Results for orders placed or performed in visit on 01/25/15  Microscopic Examination  Result Value Ref Range   WBC, UA None seen 0 -  5 /hpf   RBC, UA None seen 0 -  2 /hpf   Epithelial Cells (non renal) None seen 0 - 10 /hpf   Bacteria, UA None seen None seen/Few  Urinalysis, Complete  Result Value Ref Range   Specific Gravity, UA 1.015  1.005 - 1.030   pH, UA 7.0 5.0 - 7.5   Color, UA Yellow Yellow   Appearance Ur Clear Clear   Leukocytes, UA Negative Negative   Protein, UA Negative Negative/Trace   Glucose, UA Negative Negative   Ketones, UA Negative Negative   RBC, UA Negative Negative   Bilirubin, UA Negative Negative   Urobilinogen, Ur 0.2  0.2 - 1.0 mg/dL   Nitrite, UA Negative Negative   Microscopic Examination See below:   BLADDER SCAN AMB NON-IMAGING  Result Value Ref Range   Scan Result 40    Pertinent Imaging: Results for orders placed or performed in visit on 01/25/15  Microscopic Examination  Result Value Ref Range   WBC, UA None seen 0 -  5 /hpf   RBC, UA None seen 0 -  2 /hpf   Epithelial Cells (non renal) None seen 0 - 10 /hpf   Bacteria, UA None seen None seen/Few  Urinalysis, Complete  Result Value Ref Range   Specific Gravity, UA 1.015 1.005 - 1.030   pH, UA 7.0 5.0 - 7.5   Color, UA Yellow Yellow   Appearance Ur Clear Clear   Leukocytes, UA Negative Negative   Protein, UA Negative Negative/Trace   Glucose, UA Negative Negative   Ketones, UA Negative Negative   RBC, UA Negative Negative   Bilirubin, UA Negative Negative   Urobilinogen, Ur 0.2 0.2 - 1.0 mg/dL   Nitrite, UA Negative Negative   Microscopic Examination See below:   BLADDER SCAN AMB NON-IMAGING  Result Value Ref Range   Scan Result 40     Assessment & Plan:    1. Gross hematuria:   CT Urogram completed on 12/21/2014 noted bilateral nephrolithiasis, bilateral renal cysts and poor opacification of the left ureter. It would be advisable for the patient to undergo cystoscopy with bilateral retrogrades, but the patient is also being considered for a HOLEP procedure.If it is possible, he would like to have both procedures completed at the same time. He did undergo a cystoscopic examination in April 2016 and no bladder tumors were seen at that time.  Patient will RTC in November and we will readdress the HOLEP and retrogrades  and possible URS/LL/stent placement at that time.  - Urinalysis, Complete  2. Suprapubic pain: Patient's suprapubic pain has resolved.  UA is negative for infection. PVR is not impressive. He will return in November for a PVR, Uroflow, UA and office visit.  - BLADDER SCAN AMB NON-IMAGING   Return in about 2 months (around 03/27/2015) for PVR, Uroflow and UA.  Zara Council, [facility] [address] [place], [place] [address] [phone]

## 2015-01-26 LAB — URINALYSIS, COMPLETE
BILIRUBIN UA: NEGATIVE
GLUCOSE, UA: NEGATIVE
KETONES UA: NEGATIVE
LEUKOCYTES UA: NEGATIVE
Nitrite, UA: NEGATIVE
PROTEIN UA: NEGATIVE
RBC UA: NEGATIVE
SPEC GRAV UA: 1.015 (ref 1.005–1.030)
Urobilinogen, Ur: 0.2 mg/dL (ref 0.2–1.0)
pH, UA: 7 (ref 5.0–7.5)

## 2015-01-26 LAB — MICROSCOPIC EXAMINATION
Bacteria, UA: NONE SEEN
Epithelial Cells (non renal): NONE SEEN /hpf (ref 0–10)
RBC, UA: NONE SEEN /hpf (ref 0–?)
WBC, UA: NONE SEEN /hpf (ref 0–?)

## 2015-04-04 DIAGNOSIS — G629 Polyneuropathy, unspecified: Secondary | ICD-10-CM | POA: Diagnosis not present

## 2015-04-04 DIAGNOSIS — R1084 Generalized abdominal pain: Secondary | ICD-10-CM | POA: Diagnosis not present

## 2015-04-04 DIAGNOSIS — G894 Chronic pain syndrome: Secondary | ICD-10-CM | POA: Diagnosis not present

## 2015-04-08 ENCOUNTER — Ambulatory Visit: Payer: Medicare Other

## 2015-04-09 DIAGNOSIS — Z23 Encounter for immunization: Secondary | ICD-10-CM | POA: Diagnosis not present

## 2015-06-13 ENCOUNTER — Ambulatory Visit (INDEPENDENT_AMBULATORY_CARE_PROVIDER_SITE_OTHER): Payer: Medicare Other | Admitting: Podiatry

## 2015-06-13 ENCOUNTER — Encounter: Payer: Self-pay | Admitting: Podiatry

## 2015-06-13 VITALS — BP 106/62 | HR 84 | Resp 16

## 2015-06-13 DIAGNOSIS — M722 Plantar fascial fibromatosis: Secondary | ICD-10-CM

## 2015-06-13 NOTE — Progress Notes (Signed)
He presents today with a chief complaint of bilateral heel pain.   Objective: pulses are palpable bilateral. Pain on palpation medial continued tubercles bilateral. Neurologic sensorium is intact. Muscle strength is normal. Vital signs are stable. No apparent distress.  Assessment: plantar fasciitis chronic in nature bilateral right greater than left.  Plan: reinjected his bilateral heels today with Kenalog and local anesthetic last time he was here was July.

## 2015-06-20 ENCOUNTER — Other Ambulatory Visit: Payer: Self-pay | Admitting: Urology

## 2015-06-20 DIAGNOSIS — R109 Unspecified abdominal pain: Secondary | ICD-10-CM

## 2015-07-01 DIAGNOSIS — Z79891 Long term (current) use of opiate analgesic: Secondary | ICD-10-CM | POA: Diagnosis not present

## 2015-07-01 DIAGNOSIS — Z5181 Encounter for therapeutic drug level monitoring: Secondary | ICD-10-CM | POA: Diagnosis not present

## 2015-07-01 DIAGNOSIS — G629 Polyneuropathy, unspecified: Secondary | ICD-10-CM | POA: Diagnosis not present

## 2015-07-01 DIAGNOSIS — Z79899 Other long term (current) drug therapy: Secondary | ICD-10-CM | POA: Diagnosis not present

## 2015-07-01 DIAGNOSIS — K589 Irritable bowel syndrome without diarrhea: Secondary | ICD-10-CM | POA: Diagnosis not present

## 2015-07-01 DIAGNOSIS — G894 Chronic pain syndrome: Secondary | ICD-10-CM | POA: Diagnosis not present

## 2015-07-01 DIAGNOSIS — R1084 Generalized abdominal pain: Secondary | ICD-10-CM | POA: Diagnosis not present

## 2015-07-05 DIAGNOSIS — J0100 Acute maxillary sinusitis, unspecified: Secondary | ICD-10-CM | POA: Diagnosis not present

## 2015-07-18 DIAGNOSIS — H2513 Age-related nuclear cataract, bilateral: Secondary | ICD-10-CM | POA: Diagnosis not present

## 2015-08-17 DIAGNOSIS — E039 Hypothyroidism, unspecified: Secondary | ICD-10-CM | POA: Diagnosis not present

## 2015-08-17 DIAGNOSIS — Z125 Encounter for screening for malignant neoplasm of prostate: Secondary | ICD-10-CM | POA: Diagnosis not present

## 2015-08-17 DIAGNOSIS — E785 Hyperlipidemia, unspecified: Secondary | ICD-10-CM | POA: Diagnosis not present

## 2015-08-17 DIAGNOSIS — Z0000 Encounter for general adult medical examination without abnormal findings: Secondary | ICD-10-CM | POA: Diagnosis not present

## 2015-08-24 DIAGNOSIS — E785 Hyperlipidemia, unspecified: Secondary | ICD-10-CM | POA: Diagnosis not present

## 2015-08-24 DIAGNOSIS — Z125 Encounter for screening for malignant neoplasm of prostate: Secondary | ICD-10-CM | POA: Diagnosis not present

## 2015-08-24 DIAGNOSIS — Z0000 Encounter for general adult medical examination without abnormal findings: Secondary | ICD-10-CM | POA: Diagnosis not present

## 2015-08-24 DIAGNOSIS — I10 Essential (primary) hypertension: Secondary | ICD-10-CM | POA: Diagnosis not present

## 2015-08-24 DIAGNOSIS — K219 Gastro-esophageal reflux disease without esophagitis: Secondary | ICD-10-CM | POA: Diagnosis not present

## 2015-08-24 DIAGNOSIS — E039 Hypothyroidism, unspecified: Secondary | ICD-10-CM | POA: Diagnosis not present

## 2015-08-24 DIAGNOSIS — F5104 Psychophysiologic insomnia: Secondary | ICD-10-CM | POA: Diagnosis not present

## 2015-09-28 DIAGNOSIS — G629 Polyneuropathy, unspecified: Secondary | ICD-10-CM | POA: Diagnosis not present

## 2015-09-28 DIAGNOSIS — Z79891 Long term (current) use of opiate analgesic: Secondary | ICD-10-CM | POA: Diagnosis not present

## 2015-09-28 DIAGNOSIS — R1084 Generalized abdominal pain: Secondary | ICD-10-CM | POA: Diagnosis not present

## 2015-09-28 DIAGNOSIS — G894 Chronic pain syndrome: Secondary | ICD-10-CM | POA: Diagnosis not present

## 2015-11-07 ENCOUNTER — Ambulatory Visit (INDEPENDENT_AMBULATORY_CARE_PROVIDER_SITE_OTHER): Payer: Medicare Other | Admitting: Podiatry

## 2015-11-07 ENCOUNTER — Encounter: Payer: Self-pay | Admitting: Podiatry

## 2015-11-07 DIAGNOSIS — M722 Plantar fascial fibromatosis: Secondary | ICD-10-CM

## 2015-11-07 NOTE — Progress Notes (Signed)
He presents today with chief complaint of bilateral heel pain he states that I waited 5 months.  Objective: On signs are stable he is alert and oriented 3 still has severe heel pain on palpation mucogingival bilateral.  Assessment: Idiopathic chronic intractable fasciitis bilateral.  Plan: Injected bilateral heels today will follow up with him in 5 or 6 months.

## 2015-12-29 DIAGNOSIS — G894 Chronic pain syndrome: Secondary | ICD-10-CM | POA: Diagnosis not present

## 2015-12-29 DIAGNOSIS — R1084 Generalized abdominal pain: Secondary | ICD-10-CM | POA: Diagnosis not present

## 2015-12-29 DIAGNOSIS — Z79891 Long term (current) use of opiate analgesic: Secondary | ICD-10-CM | POA: Diagnosis not present

## 2015-12-29 DIAGNOSIS — G629 Polyneuropathy, unspecified: Secondary | ICD-10-CM | POA: Diagnosis not present

## 2016-02-06 ENCOUNTER — Ambulatory Visit (INDEPENDENT_AMBULATORY_CARE_PROVIDER_SITE_OTHER): Payer: Medicare Other | Admitting: Podiatry

## 2016-02-06 ENCOUNTER — Encounter: Payer: Self-pay | Admitting: Podiatry

## 2016-02-06 DIAGNOSIS — M722 Plantar fascial fibromatosis: Secondary | ICD-10-CM | POA: Diagnosis not present

## 2016-02-06 DIAGNOSIS — G629 Polyneuropathy, unspecified: Secondary | ICD-10-CM

## 2016-02-06 NOTE — Progress Notes (Signed)
He presents today so complaining of bilateral heel pain. He states that they just ache.  Objective: Vital signs are stable he is alert and oriented 3 he has a history of neurological deficits to the bilateral lower extremity and of the bowels. He still has pain on palpation medial calcaneal tubercles bilateral pulses remain palpable.  Assessment: Pain in limb secondary to plantar fasciitis and neurologic deficit per neurologist.  Plan: I injected the bilateral heels once again today with Kenalog and local anesthetic also requested a compounded pain cream to be sent to his home. Follow-up with him in 3 weeks if necessary.

## 2016-02-27 ENCOUNTER — Ambulatory Visit: Payer: Medicare Other | Admitting: Podiatry

## 2016-03-03 DIAGNOSIS — Z23 Encounter for immunization: Secondary | ICD-10-CM | POA: Diagnosis not present

## 2016-03-29 DIAGNOSIS — G894 Chronic pain syndrome: Secondary | ICD-10-CM | POA: Diagnosis not present

## 2016-03-29 DIAGNOSIS — Z0289 Encounter for other administrative examinations: Secondary | ICD-10-CM | POA: Diagnosis not present

## 2016-03-29 DIAGNOSIS — Z79891 Long term (current) use of opiate analgesic: Secondary | ICD-10-CM | POA: Diagnosis not present

## 2016-03-29 DIAGNOSIS — R1084 Generalized abdominal pain: Secondary | ICD-10-CM | POA: Diagnosis not present

## 2016-03-29 DIAGNOSIS — G629 Polyneuropathy, unspecified: Secondary | ICD-10-CM | POA: Diagnosis not present

## 2016-05-04 DIAGNOSIS — I10 Essential (primary) hypertension: Secondary | ICD-10-CM | POA: Diagnosis not present

## 2016-05-04 DIAGNOSIS — R220 Localized swelling, mass and lump, head: Secondary | ICD-10-CM | POA: Diagnosis not present

## 2016-05-09 ENCOUNTER — Encounter: Payer: Self-pay | Admitting: Podiatry

## 2016-05-09 ENCOUNTER — Ambulatory Visit (INDEPENDENT_AMBULATORY_CARE_PROVIDER_SITE_OTHER): Payer: Medicare Other

## 2016-05-09 ENCOUNTER — Ambulatory Visit (INDEPENDENT_AMBULATORY_CARE_PROVIDER_SITE_OTHER): Payer: Medicare Other | Admitting: Podiatry

## 2016-05-09 DIAGNOSIS — M629 Disorder of muscle, unspecified: Secondary | ICD-10-CM

## 2016-05-09 DIAGNOSIS — R52 Pain, unspecified: Secondary | ICD-10-CM | POA: Diagnosis not present

## 2016-05-09 MED ORDER — DICLOFENAC SODIUM 1 % TD GEL: 4.0000 g | Freq: Four times a day (QID) | TRANSDERMAL | 3 refills | Status: DC

## 2016-05-10 NOTE — Progress Notes (Signed)
He presents today with a worsening of pain to the plantar aspect of his right heel. He states that the pain doesn't feel the same and is starting to radiate up the medial aspect of the ankle.  Objective: Vital signs are stable he is alert and oriented 3. Pulses are palpable. His dull achy pain on palpation of the medial calcaneal tubercle and the plantar fascia is nonpalpable but he does have a history of an endoscopic fasciotomy. Radiographs do demonstrate what appears to be a possible tear with a area of intense inflammation.  Assessment: Probable tear of the plantar fascia right.  Plan: I placed him in his [place] for one month and we'll follow-up with him at that time.

## 2016-06-05 DIAGNOSIS — I10 Essential (primary) hypertension: Secondary | ICD-10-CM | POA: Diagnosis not present

## 2016-06-05 DIAGNOSIS — M109 Gout, unspecified: Secondary | ICD-10-CM | POA: Diagnosis not present

## 2016-06-05 DIAGNOSIS — E785 Hyperlipidemia, unspecified: Secondary | ICD-10-CM | POA: Diagnosis not present

## 2016-06-05 DIAGNOSIS — Z833 Family history of diabetes mellitus: Secondary | ICD-10-CM | POA: Diagnosis not present

## 2016-06-13 ENCOUNTER — Encounter: Payer: Self-pay | Admitting: Podiatry

## 2016-06-13 ENCOUNTER — Ambulatory Visit (INDEPENDENT_AMBULATORY_CARE_PROVIDER_SITE_OTHER): Payer: Medicare Other | Admitting: Podiatry

## 2016-06-13 DIAGNOSIS — M722 Plantar fascial fibromatosis: Secondary | ICD-10-CM

## 2016-06-13 NOTE — Progress Notes (Signed)
He presents today to complaint of bilateral heel pain.  Objective: Pulses are palpable. Moderate bilateral plantar fascia pain.  Assessment: Plantar fasciitis bilateral.  Plan: Injected bilateral heels today with 10 mg of Kenalog bilaterally. I will follow-up with him on an as-needed basis.

## 2016-06-25 DIAGNOSIS — G629 Polyneuropathy, unspecified: Secondary | ICD-10-CM | POA: Diagnosis not present

## 2016-06-25 DIAGNOSIS — G894 Chronic pain syndrome: Secondary | ICD-10-CM | POA: Diagnosis not present

## 2016-06-25 DIAGNOSIS — Z79891 Long term (current) use of opiate analgesic: Secondary | ICD-10-CM | POA: Diagnosis not present

## 2016-06-25 DIAGNOSIS — M79671 Pain in right foot: Secondary | ICD-10-CM | POA: Diagnosis not present

## 2016-06-25 DIAGNOSIS — M79672 Pain in left foot: Secondary | ICD-10-CM | POA: Diagnosis not present

## 2016-06-25 DIAGNOSIS — K589 Irritable bowel syndrome without diarrhea: Secondary | ICD-10-CM | POA: Diagnosis not present

## 2016-06-25 DIAGNOSIS — R1084 Generalized abdominal pain: Secondary | ICD-10-CM | POA: Diagnosis not present

## 2016-06-25 DIAGNOSIS — Z0289 Encounter for other administrative examinations: Secondary | ICD-10-CM | POA: Diagnosis not present

## 2016-08-21 DIAGNOSIS — E785 Hyperlipidemia, unspecified: Secondary | ICD-10-CM | POA: Diagnosis not present

## 2016-08-21 DIAGNOSIS — M109 Gout, unspecified: Secondary | ICD-10-CM | POA: Diagnosis not present

## 2016-08-21 DIAGNOSIS — I10 Essential (primary) hypertension: Secondary | ICD-10-CM | POA: Diagnosis not present

## 2016-08-21 DIAGNOSIS — Z833 Family history of diabetes mellitus: Secondary | ICD-10-CM | POA: Diagnosis not present

## 2016-08-21 DIAGNOSIS — Z125 Encounter for screening for malignant neoplasm of prostate: Secondary | ICD-10-CM | POA: Diagnosis not present

## 2016-08-28 DIAGNOSIS — I10 Essential (primary) hypertension: Secondary | ICD-10-CM | POA: Diagnosis not present

## 2016-08-28 DIAGNOSIS — E119 Type 2 diabetes mellitus without complications: Secondary | ICD-10-CM | POA: Diagnosis not present

## 2016-08-28 DIAGNOSIS — Z0000 Encounter for general adult medical examination without abnormal findings: Secondary | ICD-10-CM | POA: Diagnosis not present

## 2016-08-28 DIAGNOSIS — M79605 Pain in left leg: Secondary | ICD-10-CM | POA: Diagnosis not present

## 2016-08-28 DIAGNOSIS — L570 Actinic keratosis: Secondary | ICD-10-CM | POA: Diagnosis not present

## 2016-08-28 DIAGNOSIS — Z23 Encounter for immunization: Secondary | ICD-10-CM | POA: Diagnosis not present

## 2016-08-28 DIAGNOSIS — E785 Hyperlipidemia, unspecified: Secondary | ICD-10-CM | POA: Diagnosis not present

## 2016-09-01 DIAGNOSIS — Z23 Encounter for immunization: Secondary | ICD-10-CM | POA: Diagnosis not present

## 2016-09-03 ENCOUNTER — Encounter (INDEPENDENT_AMBULATORY_CARE_PROVIDER_SITE_OTHER): Payer: Self-pay | Admitting: Vascular Surgery

## 2016-09-03 ENCOUNTER — Ambulatory Visit (INDEPENDENT_AMBULATORY_CARE_PROVIDER_SITE_OTHER): Payer: Medicare Other | Admitting: Vascular Surgery

## 2016-09-03 VITALS — BP 147/91 | HR 73 | Resp 18 | Ht 66.0 in | Wt 214.0 lb

## 2016-09-03 DIAGNOSIS — E782 Mixed hyperlipidemia: Secondary | ICD-10-CM | POA: Insufficient documentation

## 2016-09-03 DIAGNOSIS — M79604 Pain in right leg: Secondary | ICD-10-CM | POA: Diagnosis not present

## 2016-09-03 DIAGNOSIS — M79605 Pain in left leg: Secondary | ICD-10-CM

## 2016-09-03 DIAGNOSIS — I10 Essential (primary) hypertension: Secondary | ICD-10-CM

## 2016-09-03 DIAGNOSIS — E785 Hyperlipidemia, unspecified: Secondary | ICD-10-CM

## 2016-09-03 NOTE — Progress Notes (Signed)
Subjective:    Patient ID: Troy Rojas, male    DOB: 20-Mar-1951, 66 y.o.   MRN: [id number] Chief Complaint  Patient presents with  . New Patient (Initial Visit)   Presents as a new patient referred by Dr. Kary Kos for "lower extremity pain". Patient states he was seen by a vascular surgery about 10 years ago and believes he was diagnosed with a "vein problem". Endorses being waken up at night with pain in his left leg. Pain is located on the anterior calf. States it has been "going on for the last 3-4 years". After being woke up the patient will need to walk around or use a heating pad. The pain would usually last about an hour then resolve now the pain is lasting longer (all day) and happening more frequently. Denies any claudication or ulceration to the lower extremity. No history of trauma to the leg. Denies any fever, nausea or vomiting.    Review of Systems  Constitutional: Negative.   HENT: Negative.   Eyes: Negative.   Respiratory: Negative.   Cardiovascular: Negative.        Lower Extremity Pain. L>R  Gastrointestinal: Negative.   Endocrine: Negative.   Genitourinary: Negative.   Musculoskeletal: Negative.   Skin: Negative.   Allergic/Immunologic: Negative.   Neurological: Negative.   Hematological: Negative.   Psychiatric/Behavioral: Negative.       Objective:   Physical Exam  Constitutional: He is oriented to person, place, and time. He appears well-developed and well-nourished. No distress.  HENT:  Head: Normocephalic and atraumatic.  Eyes: Conjunctivae are normal. Pupils are equal, round, and reactive to light.  Neck: Normal range of motion.  Cardiovascular: Normal rate, regular rhythm, normal heart sounds and intact distal pulses.   Pulses:      Radial pulses are 2+ on the right side, and 2+ on the left side.       Dorsalis pedis pulses are 2+ on the right side, and 2+ on the left side.       Posterior tibial pulses are 2+ on the right side, and 2+ on the left  side.  Pulmonary/Chest: Effort normal.  Abdominal: Soft.  Musculoskeletal: Normal range of motion. He exhibits no edema.  Neurological: He is alert and oriented to person, place, and time.  Skin: Skin is warm and dry. He is not diaphoretic.  <1cm bilateral scattered varicose veins  Psychiatric: He has a normal mood and affect. His behavior is normal. Judgment and thought content normal.  Vitals reviewed.  BP (!) 147/91   Pulse 73   Resp 18   Ht 5\' 6"  (1.676 m)   Wt 214 lb (97.1 kg)   BMI 34.54 kg/m   Past Medical History:  Diagnosis Date  . BPH (benign prostatic hyperplasia)   . Diverticulosis   . Dysuria   . HLD (hyperlipidemia)   . Hydronephrosis   . Hypertension   . Hypothyroidism   . Insomnia   . Marginal ulcers   . Nocturia   . Over weight   . Prostatitis   . Renal stones   . Testicular hypofunction   . Thyroid disease   . Ulcer   . Urinary frequency    Social History   Social History  . Marital status: Married    Spouse name: N/A  . Number of children: N/A  . Years of education: N/A   Occupational History  . Not on file.   Social History Main Topics  . Smoking status: Never Smoker  .  Smokeless tobacco: Never Used  . Alcohol use No  . Drug use: No  . Sexual activity: Not on file   Other Topics Concern  . Not on file   Social History Narrative  . No narrative on file   Past Surgical History:  Procedure Laterality Date  . APPENDECTOMY    . HEMORRHOID SURGERY    . LITHOTRIPSY     x 3  . NASAL SINUS SURGERY    . STOMACH SURGERY    . VASECTOMY     Family History  Problem Relation Age of Onset  . Kidney disease Mother   . Diabetes type II Mother   . Prostate cancer Neg Hx    Allergies  Allergen Reactions  . Amitriptyline     Reaction unknown  . Duloxetine Other (See Comments)    Reaction unkown  . Duloxetine Hcl     Reaction unkown  . Pollen Extract Other (See Comments)    Sinus issues      Assessment & Plan:  Presents as a new  patient referred by Dr. Kary Kos for "lower extremity pain". Patient states he was seen by a vascular surgery about 10 years ago and believes he was diagnosed with a "vein problem". Endorses being waken up at night with pain in his left leg. Pain is located on the anterior calf. States it has been "going on for the last 3-4 years". After being woke up the patient will need to walk around or use a heating pad. The pain would usually last about an hour then resolve now the pain is lasting longer (all day) and happening more frequently. Denies any claudication or ulceration to the lower extremity. No history of trauma to the leg. Denies any fever, nausea or vomiting.   1. Pain in both lower extremities - New Patient with risk factors for PAD (HLD, HTN) Will order ABI to rule out any contributing PAD  - VAS [place] ABI WITH/WO TBI; Future  The patient was encouraged to wear graduated compression stockings (20-30 mmHg) on a daily basis. The patient was instructed to begin wearing the stockings first thing in the morning and removing them in the evening. The patient was instructed specifically not to sleep in the stockings. Prescription given. In addition, behavioral modification including elevation during the day will be initiated. Anti-inflammatories for pain. Will order a venous duplex to rule out reflux  - VAS [place] LOWER EXTREMITY VENOUS REFLUX; Future  2. Essential hypertension - Stable Encouraged good control as its slows the progression of atherosclerotic disease  3. Hyperlipidemia, unspecified hyperlipidemia type - Stable Encouraged good control as its slows the progression of atherosclerotic disease  Current Outpatient Prescriptions on File Prior to Visit  Medication Sig Dispense Refill  . allopurinol (ZYLOPRIM) 300 MG tablet     . amLODipine (NORVASC) 10 MG tablet Take by mouth.    . diclofenac sodium (VOLTAREN) 1 % GEL Apply 4 g topically 4 (four) times daily. 100 g 3  . fenofibrate micronized  (LOFIBRA) 134 MG capsule     . finasteride (PROSCAR) 5 MG tablet Take 1 tablet (5 mg total) by mouth daily. 90 tablet 3  . fluticasone (FLONASE) 50 MCG/ACT nasal spray Place into the nose.    . gabapentin (NEURONTIN) 600 MG tablet     . levothyroxine (SYNTHROID, LEVOTHROID) 125 MCG tablet     . loratadine (CLARITIN) 10 MG tablet Take 10 mg by mouth.    . NONFORMULARY OR COMPOUNDED ITEM Combination Pain Cream-[facility]  2 refills    . nortriptyline (PAMELOR) 25 MG capsule     . Oxycodone HCl 10 MG TABS     . tamsulosin (FLOMAX) 0.4 MG CAPS capsule TAKE 1 CAPSULE BY MOUTH ONCE A DAY 30 capsule 11  . zolpidem (AMBIEN) 10 MG tablet     . pantoprazole (PROTONIX) 40 MG tablet Take by mouth.     No current facility-administered medications on file prior to visit.     There are no Patient Instructions on file for this visit. No Follow-up on file.   Jahkai Yandell A Shaheen Mende, PA-C

## 2016-09-14 ENCOUNTER — Ambulatory Visit: Payer: Medicare Other | Admitting: *Deleted

## 2016-09-18 ENCOUNTER — Encounter: Payer: Medicare Other | Attending: Family Medicine | Admitting: Dietician

## 2016-09-18 ENCOUNTER — Encounter: Payer: Self-pay | Admitting: Dietician

## 2016-09-18 VITALS — BP 122/90 | Ht 66.0 in | Wt 212.7 lb

## 2016-09-18 DIAGNOSIS — E119 Type 2 diabetes mellitus without complications: Secondary | ICD-10-CM | POA: Insufficient documentation

## 2016-09-18 DIAGNOSIS — Z713 Dietary counseling and surveillance: Secondary | ICD-10-CM | POA: Insufficient documentation

## 2016-09-18 DIAGNOSIS — E1169 Type 2 diabetes mellitus with other specified complication: Secondary | ICD-10-CM

## 2016-09-18 NOTE — Progress Notes (Signed)
Diabetes Self-Management Education  Visit Type: First/Initial  Appt. Start Time: 1330 Appt. End Time: 1610  09/18/2016  [name], identified by name and date of birth, is a 66 y.o. male with a diagnosis of Diabetes: Type 2.   ASSESSMENT  Blood pressure 122/90, height 5\' 6"  (1.676 m), weight 212 lb 11.2 oz (96.5 kg). Body mass index is 34.33 kg/m.      Diabetes Self-Management Education - 09/18/16 1547      Visit Information   Visit Type First/Initial     Initial Visit   Diabetes Type Type 2     Health Coping   How would you rate your overall health? Fair     Psychosocial Assessment   Patient Belief/Attitude about Diabetes Motivated to manage diabetes   Self-care barriers None   Self-management support Doctor's office   Other persons present Patient   Patient Concerns Weight Control;Glycemic Control;Medication  prevent complications   Special Needs None   Preferred Learning Style Auditory;Visual   Learning Readiness Ready     Pre-Education Assessment   Patient understands the diabetes disease and treatment process. Needs Instruction   Patient understands incorporating nutritional management into lifestyle. Needs Instruction   Patient undertands incorporating physical activity into lifestyle. Needs Instruction   Patient understands using medications safely. Needs Instruction   Patient understands monitoring blood glucose, interpreting and using results Needs Instruction   Patient understands prevention, detection, and treatment of acute complications. Needs Instruction   Patient understands prevention, detection, and treatment of chronic complications. Needs Instruction   Patient understands how to develop strategies to address psychosocial issues. Needs Instruction   Patient understands how to develop strategies to promote health/change behavior. Needs Instruction     Complications   Last HgB A1C per patient/outside source 7.8 %  08-21-2016   How often do you  check your blood sugar? 0 times/day (not testing)   Have you had a dilated eye exam in the past 12 months? No  2 yrs ago   Have you had a dental exam in the past 12 months? Yes  1 month ago   Are you checking your feet? No-c/o constant burning pain in feet (pt reports has plantar fascitis and nerve pain)     Dietary Intake   Breakfast skips-pt does not want to eat breakfast   Snack (morning) none   Lunch --  eats meal at 11:30a   Snack (afternoon) eats canned fruit + cottage cheese or yogurt at [employer] --  eats supper at 6:30p   Snack (evening) --  eats crackers at 9-10p; eats fried foods 2-3x/wk   Beverage(s) --  drinks water 4-5x/day,unsweetened beverages 2-3x/day + milk 1-2x/day     Exercise   Exercise Type ADL's     Patient Education   Previous Diabetes Education No   Disease state  Definition of diabetes, type 1 and 2, and the diagnosis of diabetes;Explored patient's options for treatment of their diabetes;Factors that contribute to the development of diabetes   Nutrition management  Role of diet in the treatment of diabetes and the relationship between the three main macronutrients and blood glucose level;Food label reading, portion sizes and measuring food.;Carbohydrate counting   Physical activity and exercise  Role of exercise on diabetes management, blood pressure control and cardiac health.;Helped patient identify appropriate exercises in relation to his/her diabetes, diabetes complications and other health issue.  gave pt booklet Exercise and Physical Activity-encouraged to try exercises as able    Monitoring Taught/evaluated  SMBG meter.;Purpose and frequency of SMBG.;Taught/discussed recording of test results and interpretation of SMBG.;Identified appropriate SMBG and/or A1C goals.;Daily foot exams;Yearly dilated eye exam  gave pt [employer] Guide meter and instructed on its use-FBG [id number]   Chronic complications Relationship between chronic complications and blood glucose  control;Assessed and discussed foot care and prevention of foot problems;Dental care;Retinopathy and reason for yearly dilated eye exams;Nephropathy, what it is, prevention of, the use of ACE, ARB's and early detection of through urine microalbumia.;Identified and discussed with patient  current chronic complications;Lipid levels, blood glucose control and heart disease   Personal strategies to promote health Lifestyle issues that need to be addressed for better diabetes care;Helped patient develop diabetes management plan for (enter comment)     Outcomes   Expected Outcomes Demonstrated interest in learning. Expect positive outcomes   Program Status Other (comment)      Individualized Plan for Diabetes Self-Management Training:   Learning Objective:  Patient will have a greater understanding of diabetes self-management. Patient education plan is to attend individual and/or group sessions per assessed needs and concerns.   Plan:   Patient Instructions   Check blood sugars 2 x day before breakfast and 2 hrs after supper every other day and record  Exercise:  Try exercises in exercise booklet as able  Avoid sugar sweetened drinks (soda, tea, coffee, sports drinks, juices)  Limit intake of fried foods and sweets  Eat 2 meals day,   2  snacks a day in afternoon and at bedtime  Space meals 4-6 hours apart  Eat 3 carbohydrate servings/meal + protein  Eat 1-2 carbohydrate servings/snack + protein  Make eye dr appointment  Check feet daily  Bring blood sugar records to the next appointment/class  Call your doctor for a prescription for:  1. Meter strips (type): [employer]uc[place]k Guide test strips checking  2 times per day  2. Lancets (type):  [employer] Fastclix  checking  2   times per day  Return for appointment/classes on:  10-11-16   Expected Outcomes:  Demonstrated interest in learning. Expect positive outcomes  Education material provided: General meal planning guidelines, Exercise  and Physical Activity booklet, Accuc[place]k Guide meter  If problems or questions, patient to contact team via: [phone]  Future DSME appointment:  10-11-16

## 2016-09-18 NOTE — Patient Instructions (Signed)
  Check blood sugars 2 x day before breakfast and 2 hrs after supper every other day and record  Exercise:  Try exercises in exercise booklet as able  Avoid sugar sweetened drinks (soda, tea, coffee, sports drinks, juices)  Limit intake of fried foods and sweets  Eat 2 meals day,   2  snacks a day in afternoon and at bedtime  Space meals 4-6 hours apart  Eat 3 carbohydrate servings/meal + protein  Eat 1-2 carbohydrate servings/snack + protein  Make eye dr appointment  Bring blood sugar records to the next appointment/class  Call your doctor for a prescription for:  1. Meter strips (type): Accuchek Guide test strips checking  2 times per day  2. Lancets (type):  Accuchek Fastclix  checking  2   times per day  Return for appointment/classes on:  10-11-16

## 2016-09-19 ENCOUNTER — Encounter: Payer: Self-pay | Admitting: Podiatry

## 2016-09-19 ENCOUNTER — Ambulatory Visit (INDEPENDENT_AMBULATORY_CARE_PROVIDER_SITE_OTHER): Payer: Medicare Other | Admitting: Podiatry

## 2016-09-19 DIAGNOSIS — M722 Plantar fascial fibromatosis: Secondary | ICD-10-CM

## 2016-09-20 NOTE — Progress Notes (Signed)
He presents today for follow-up of bilateral heel pain. He states it is really doing pretty good as past few months however started to hurt once again is here for bilateral heels plantarly.  Objective: Vital signs are stable he is alert and oriented 3. Pulses are palpable area he has pain on palpation medial calcaneal tubercles bilateral left greater than right. No erythema edema cellulitis drainage or odor no open lesions or lesions are noted. No palpable calf pain.  Assessment: Plantar fasciitis bilateral chronic in nature.  Plan: Injected the bilateral heels today with Kenalog and local anesthetic. We'll follow-up with him on an as-needed basis no sooner than 3 months these multiple injections.

## 2016-09-24 DIAGNOSIS — R1084 Generalized abdominal pain: Secondary | ICD-10-CM | POA: Diagnosis not present

## 2016-09-24 DIAGNOSIS — G894 Chronic pain syndrome: Secondary | ICD-10-CM | POA: Diagnosis not present

## 2016-09-24 DIAGNOSIS — G629 Polyneuropathy, unspecified: Secondary | ICD-10-CM | POA: Diagnosis not present

## 2016-09-24 DIAGNOSIS — Z79891 Long term (current) use of opiate analgesic: Secondary | ICD-10-CM | POA: Diagnosis not present

## 2016-10-11 ENCOUNTER — Ambulatory Visit: Payer: Medicare Other

## 2016-10-18 ENCOUNTER — Ambulatory Visit: Payer: Medicare Other

## 2016-10-25 ENCOUNTER — Ambulatory Visit: Payer: Medicare Other

## 2016-10-30 ENCOUNTER — Encounter: Payer: Self-pay | Admitting: Dietician

## 2016-11-01 ENCOUNTER — Encounter (INDEPENDENT_AMBULATORY_CARE_PROVIDER_SITE_OTHER): Payer: Medicare Other

## 2016-11-01 ENCOUNTER — Ambulatory Visit (INDEPENDENT_AMBULATORY_CARE_PROVIDER_SITE_OTHER): Payer: Medicare Other | Admitting: Vascular Surgery

## 2016-12-19 ENCOUNTER — Ambulatory Visit (INDEPENDENT_AMBULATORY_CARE_PROVIDER_SITE_OTHER): Payer: Medicare Other | Admitting: Podiatry

## 2016-12-19 ENCOUNTER — Encounter: Payer: Self-pay | Admitting: Podiatry

## 2016-12-19 DIAGNOSIS — M722 Plantar fascial fibromatosis: Secondary | ICD-10-CM | POA: Diagnosis not present

## 2016-12-19 NOTE — Progress Notes (Signed)
He presents today for follow-up of his bilateral heel states they've been hurting since July 4. History of plantar fasciitis.  Objective: Vital signs are stable alert and oriented 3. Pulses are palpable. Neurologic sensorium is intact. Deep tendon reflexes are intact. Pain on palpation medially continued tubercles bilateral.  Assessment: Was told that he had neuropathy by the neurologists but chronic proximal plantar fasciitis.  Plan: I reinjected his bilateral heels that with Kenalog and local anesthetic. I also suggested that he may follow-up with neurosurgery for possible spinal cord stimulator.

## 2016-12-24 ENCOUNTER — Ambulatory Visit: Payer: Medicare Other | Admitting: Podiatry

## 2016-12-24 DIAGNOSIS — E119 Type 2 diabetes mellitus without complications: Secondary | ICD-10-CM | POA: Diagnosis not present

## 2016-12-25 DIAGNOSIS — G894 Chronic pain syndrome: Secondary | ICD-10-CM | POA: Diagnosis not present

## 2016-12-25 DIAGNOSIS — R1084 Generalized abdominal pain: Secondary | ICD-10-CM | POA: Diagnosis not present

## 2016-12-25 DIAGNOSIS — G629 Polyneuropathy, unspecified: Secondary | ICD-10-CM | POA: Diagnosis not present

## 2016-12-25 DIAGNOSIS — Z79891 Long term (current) use of opiate analgesic: Secondary | ICD-10-CM | POA: Diagnosis not present

## 2016-12-31 DIAGNOSIS — I10 Essential (primary) hypertension: Secondary | ICD-10-CM | POA: Diagnosis not present

## 2016-12-31 DIAGNOSIS — E119 Type 2 diabetes mellitus without complications: Secondary | ICD-10-CM | POA: Diagnosis not present

## 2017-02-17 DIAGNOSIS — Z23 Encounter for immunization: Secondary | ICD-10-CM | POA: Diagnosis not present

## 2017-03-19 DIAGNOSIS — G629 Polyneuropathy, unspecified: Secondary | ICD-10-CM | POA: Diagnosis not present

## 2017-03-19 DIAGNOSIS — G894 Chronic pain syndrome: Secondary | ICD-10-CM | POA: Diagnosis not present

## 2017-03-19 DIAGNOSIS — R1084 Generalized abdominal pain: Secondary | ICD-10-CM | POA: Diagnosis not present

## 2017-03-19 DIAGNOSIS — Z79891 Long term (current) use of opiate analgesic: Secondary | ICD-10-CM | POA: Diagnosis not present

## 2017-03-25 ENCOUNTER — Ambulatory Visit: Payer: Medicare Other | Admitting: Podiatry

## 2017-03-26 DIAGNOSIS — E119 Type 2 diabetes mellitus without complications: Secondary | ICD-10-CM | POA: Diagnosis not present

## 2017-04-01 ENCOUNTER — Encounter: Payer: Self-pay | Admitting: Podiatry

## 2017-04-01 ENCOUNTER — Ambulatory Visit (INDEPENDENT_AMBULATORY_CARE_PROVIDER_SITE_OTHER): Payer: Medicare Other | Admitting: Podiatry

## 2017-04-01 DIAGNOSIS — M722 Plantar fascial fibromatosis: Secondary | ICD-10-CM | POA: Diagnosis not present

## 2017-04-01 NOTE — Progress Notes (Signed)
He presents today chief complaint of bilateral heel pain.  Objective: Vital signs are stable he is alert and oriented 3. Pulses are palpable.  Pain on palpation medial calcaneal tubercles bilateral.  Assessment: Plantar fascitis bilateral  Plan: Injected the bilateral heels.

## 2017-04-03 DIAGNOSIS — N183 Chronic kidney disease, stage 3 (moderate): Secondary | ICD-10-CM | POA: Diagnosis not present

## 2017-04-03 DIAGNOSIS — N289 Disorder of kidney and ureter, unspecified: Secondary | ICD-10-CM | POA: Diagnosis not present

## 2017-04-03 DIAGNOSIS — E1165 Type 2 diabetes mellitus with hyperglycemia: Secondary | ICD-10-CM | POA: Diagnosis not present

## 2017-04-03 DIAGNOSIS — I10 Essential (primary) hypertension: Secondary | ICD-10-CM | POA: Diagnosis not present

## 2017-04-03 DIAGNOSIS — E039 Hypothyroidism, unspecified: Secondary | ICD-10-CM | POA: Diagnosis not present

## 2017-04-03 DIAGNOSIS — Z125 Encounter for screening for malignant neoplasm of prostate: Secondary | ICD-10-CM | POA: Diagnosis not present

## 2017-04-03 DIAGNOSIS — E1122 Type 2 diabetes mellitus with diabetic chronic kidney disease: Secondary | ICD-10-CM | POA: Diagnosis not present

## 2017-07-03 ENCOUNTER — Ambulatory Visit: Payer: Medicare Other | Admitting: Podiatry

## 2017-07-10 ENCOUNTER — Ambulatory Visit (INDEPENDENT_AMBULATORY_CARE_PROVIDER_SITE_OTHER): Payer: Medicare Other | Admitting: Podiatry

## 2017-07-10 ENCOUNTER — Encounter: Payer: Self-pay | Admitting: Podiatry

## 2017-07-10 DIAGNOSIS — M722 Plantar fascial fibromatosis: Secondary | ICD-10-CM

## 2017-07-10 NOTE — Progress Notes (Signed)
He presents today chief complaint of bilateral heel pain.  States it is time for another set of injections.  Objective: Pain on palpation medial calcaneal tubercles bilateral.  Vital signs are stable he is alert and oriented x3.  Pulses are palpable.  Assessment: Chronic intractable plantar fasciitis bilateral foot.  Plan: Discussed etiology pathology conservative versus surgical therapies.  Injected the bilateral heels today with 20 mg of Kenalog 5 mg of Marcaine after sterile Betadine skin prep bilateral foot tolerated procedure well without complications follow-up with him in 3 months.

## 2017-10-09 ENCOUNTER — Encounter: Payer: Self-pay | Admitting: Podiatry

## 2017-10-09 ENCOUNTER — Ambulatory Visit (INDEPENDENT_AMBULATORY_CARE_PROVIDER_SITE_OTHER): Payer: Medicare Other | Admitting: Podiatry

## 2017-10-09 DIAGNOSIS — M722 Plantar fascial fibromatosis: Secondary | ICD-10-CM

## 2017-10-09 NOTE — Progress Notes (Signed)
He presents today chief complaint of plantar fasciitis bilaterally.  States that he really started bothering him the last few weeks particularly after he went for a long walk in a car show.  Objective: Vital signs are stable alert and oriented x3.  Pulses are palpable.  Pain on palpation medial calcaneal tubercles bilateral.  Assessment: Plantar fasciitis chronic in nature bilateral.  Plan: Injected his bilateral heels today 20 mg Kenalog 5 mg Marcaine point maximal tenderness of sterile Betadine skin prep medial aspect of bilateral heel tolerated procedure well without complications follow-up with him in 3 months at which time we will try to decrease the Kenalog to 15 or even 10 mg.

## 2017-12-30 ENCOUNTER — Encounter: Payer: Self-pay | Admitting: Dietician

## 2017-12-30 ENCOUNTER — Encounter: Payer: Medicare Other | Attending: Family Medicine | Admitting: Dietician

## 2017-12-30 VITALS — BP 122/80 | Ht 65.0 in | Wt 212.2 lb

## 2017-12-30 DIAGNOSIS — E119 Type 2 diabetes mellitus without complications: Secondary | ICD-10-CM | POA: Insufficient documentation

## 2017-12-30 DIAGNOSIS — E1165 Type 2 diabetes mellitus with hyperglycemia: Secondary | ICD-10-CM

## 2017-12-30 DIAGNOSIS — Z713 Dietary counseling and surveillance: Secondary | ICD-10-CM | POA: Diagnosis not present

## 2017-12-30 NOTE — Patient Instructions (Addendum)
  Check blood sugars 2 x day before breakfast and 2 hrs after supper every day and record Bring blood sugar records to the next appointment/class Exercise: try some exercises in Exercise & Physical Activity booklet and try recumbent bike Eat 3 meals day and 1  snack a day at bedtime Space meals 4-5 hours apart Eat 3 carbohydrate servings/meal + protein Eat 1 carbohydrate serving/snack + protein Avoid sugar sweetened drinks (soda, tea, coffee, sports drinks, juices) Drink plenty of water Limit intake of fried foods and sweets Make eye doctor appointment Start Metformin XR as directed by MD Return for appointment/classes on: 01-27-18

## 2017-12-30 NOTE — Progress Notes (Signed)
Diabetes Self-Management Education  Visit Type: First/Initial  Appt. Start Time: 1430 Appt. End Time: 5465  12/30/2017  [name], identified by name and date of birth, is a 67 y.o. male with a diagnosis of Diabetes: Type 2.   ASSESSMENT  Blood pressure 122/80, height 5\' 5"  (1.651 m), weight 212 lb 3.2 oz (96.3 kg). Body mass index is 35.31 kg/m.  Diabetes Self-Management Education - 12/30/17 1600      Visit Information   Visit Type  First/Initial      Initial Visit   Diabetes Type  Type 2      Health Coping   How would you rate your overall health?  Fair      Psychosocial Assessment   Patient Belief/Attitude about Diabetes  Motivated to manage diabetes    Self-care barriers  None    Self-management support  Doctor's office;Family    Other persons present  Patient    Patient Concerns  Weight Control;Glycemic Control   prevent complications   Special Needs  None    Preferred Learning Style  Visual;Hands on;Auditory    Learning Readiness  Ready      Pre-Education Assessment   Patient understands the diabetes disease and treatment process.  Needs Review    Patient understands incorporating nutritional management into lifestyle.  Needs Review    Patient undertands incorporating physical activity into lifestyle.  Needs Instruction    Patient understands using medications safely.  Needs Instruction    Patient understands monitoring blood glucose, interpreting and using results  Needs Review    Patient understands prevention, detection, and treatment of acute complications.  Needs Instruction    Patient understands prevention, detection, and treatment of chronic complications.  Needs Review    Patient understands how to develop strategies to address psychosocial issues.  Needs Instruction    Patient understands how to develop strategies to promote health/change behavior.  Needs Instruction      Complications   Last HgB A1C per patient/outside source  8.5 %   12-03-17    How often do you check your blood sugar?  0 times/day (not testing)    Have you had a dilated eye exam in the past 12 months?  No   1.5-2 years ago   Have you had a dental exam in the past 12 months?  Yes   5 months ago-has appt 2 wks   Are you checking your feet?  Yes   c/o pain feet due to plantar fascitis   How many days per week are you checking your feet?  7      Dietary Intake   Breakfast  skips breakfast    Snack (morning)  none    Lunch  eats lunch at 11-11:30a=sandwich    Snack (afternoon)  eats crackers and hummus at 2p    Dinner  eats supper at 6p-6p=meat and vegetables ; eats fried foods 2-3x/wk amd sweets 1x/wk    Snack (evening)  eats crackers and cheese at 12:30a-1a    Beverage(s)  drinks water 4-5x/day and unsweetened tea/coffee 4-5x/day + occasional milk x1      Exercise   Exercise Type  ADL's      Patient Education   Previous Diabetes Education  Yes (please comment)   came to initial visit only of [facility] program in 2018   Disease state   Explored patient's options for treatment of their diabetes;Definition of diabetes, type 1 and 2, and the diagnosis of diabetes    Nutrition management  Role of diet in the treatment of diabetes and the relationship between the three main macronutrients and blood glucose level;Food label reading, portion sizes and measuring food.;Carbohydrate counting    Physical activity and exercise   Role of exercise on diabetes management, blood pressure control and cardiac health.;Helped patient identify appropriate exercises in relation to his/her diabetes, diabetes complications and other health issue.    Medications  Reviewed patients medication for diabetes, action, purpose, timing of dose and side effects.    Monitoring  Purpose and frequency of SMBG.;Yearly dilated eye exam;Taught/discussed recording of test results and interpretation of SMBG.;Identified appropriate SMBG and/or A1C goals.    Chronic complications  Relationship between chronic  complications and blood glucose control;Dental care;Retinopathy and reason for yearly dilated eye exams;Reviewed with patient heart disease, higher risk of, and prevention    Personal strategies to promote health  Lifestyle issues that need to be addressed for better diabetes care;Helped patient develop diabetes management plan for (enter comment)      Outcomes   Expected Outcomes  Demonstrated interest in learning. Expect positive outcomes       Individualized Plan for Diabetes Self-Management Training:   Learning Objective:  Patient will have a greater understanding of diabetes self-management. Patient education plan is to attend individual and/or group sessions per assessed needs and concerns.   Plan:   Patient Instructions   Check blood sugars 2 x day before breakfast and 2 hrs after supper every day and record Bring blood sugar records to the next appointment/class Exercise: try some exercises in Exercise & Physical Activity booklet and try recumbent bike Eat 3 meals day and 1  snack a day at bedtime Space meals 4-5 hours apart Eat 3 carbohydrate servings/meal + protein Eat 1 carbohydrate serving/snack + protein Avoid sugar sweetened drinks (soda, tea, coffee, sports drinks, juices) Drink plenty of water Limit intake of fried foods and sweets Make eye doctor appointment Start Metformin XR as instructed by MD Return for appointment/classes on: 01-27-18   Expected Outcomes:  Demonstrated interest in learning. Expect positive outcomes  Education material provided: General meal planning guidelines, Food Group handout, Exercise & Physical Activity booklet  If problems or questions, patient to contact team via:  [phone]  Future DSME appointment:  01-27-18

## 2018-01-15 ENCOUNTER — Ambulatory Visit (INDEPENDENT_AMBULATORY_CARE_PROVIDER_SITE_OTHER): Payer: Medicare Other | Admitting: Podiatry

## 2018-01-15 ENCOUNTER — Encounter: Payer: Self-pay | Admitting: Podiatry

## 2018-01-15 DIAGNOSIS — M722 Plantar fascial fibromatosis: Secondary | ICD-10-CM | POA: Diagnosis not present

## 2018-01-15 NOTE — Progress Notes (Signed)
He presents today chief complaint of plantar fasciitis states that it flared up about 3 weeks ago or so but has been very tender.  Objective: Vital signs are stable he is alert and oriented x3.  Pulses are palpable.  Neurologic sensorium is intact degenerative flexors are intact muscle strength is normal symmetrical.  He has pain on palpation medial calcaneal tubercles bilateral.  Assessment: Pain in limb secondary to plantar fasciitis bilateral.  Plan: After sterile Betadine skin prep injected 10 mg of Kenalog 5 mg Marcaine point of maximal tenderness bilateral heels.  Tolerated procedure well without complications.  Follow-up with him on an as-needed basis or in 3 months.

## 2018-01-27 ENCOUNTER — Encounter: Payer: Self-pay | Admitting: Dietician

## 2018-01-27 ENCOUNTER — Ambulatory Visit: Payer: Medicare Other

## 2018-01-27 NOTE — Progress Notes (Signed)
Pt did not come to class 1 tonight-called pt and he does not want to reschedule at this time. Advised pt his referral is valid for 1 year and to call us if he wishes to reschedule classes

## 2018-01-28 ENCOUNTER — Encounter: Payer: Self-pay | Admitting: Dietician

## 2018-02-03 ENCOUNTER — Ambulatory Visit: Payer: Medicare Other

## 2018-02-10 ENCOUNTER — Ambulatory Visit: Payer: Medicare Other

## 2018-04-08 ENCOUNTER — Encounter: Payer: Self-pay | Admitting: Podiatry

## 2018-04-08 ENCOUNTER — Ambulatory Visit (INDEPENDENT_AMBULATORY_CARE_PROVIDER_SITE_OTHER): Payer: Medicare Other | Admitting: Podiatry

## 2018-04-08 DIAGNOSIS — M722 Plantar fascial fibromatosis: Secondary | ICD-10-CM | POA: Diagnosis not present

## 2018-04-10 NOTE — Progress Notes (Signed)
   Subjective: 67 year old male presenting today for follow up evaluation of bilateral plantar fasciitis that flared up a few weeks ago. He states the pain is greater in the right foot. He reports relief after receiving the injections in the past. He has not had any recent treatment. There are no modifying factors noted. Patient is here for further evaluation and treatment.   Past Medical History:  Diagnosis Date  . BPH (benign prostatic hyperplasia)   . Diverticulosis   . Dysuria   . History of kidney stones   . HLD (hyperlipidemia)   . Hx of gout   . Hydronephrosis   . Hypertension   . Hypothyroidism   . Insomnia   . Insomnia   . Marginal ulcers   . Neuromuscular disorder ([place])   . Nocturia   . Over weight   . Prostatitis   . Renal stones   . Testicular hypofunction   . Thyroid disease   . Ulcer   . Urinary frequency      Objective: Physical Exam General: The patient is alert and oriented x3 in no acute distress.  Dermatology: Skin is warm, dry and supple bilateral lower extremities. Negative for open lesions or macerations bilateral.   Vascular: Dorsalis Pedis and Posterior Tibial pulses palpable bilateral.  Capillary fill time is immediate to all digits.  Neurological: Epicritic and protective threshold intact bilateral.   Musculoskeletal: Tenderness to palpation to the plantar aspect of the bilateral heels along the plantar fascia. All other joints range of motion within normal limits bilateral. Strength 5/5 in all groups bilateral.    Assessment: 1. plantar fasciitis bilateral feet  Plan of Care:  1. Patient evaluated.    2. Injection of 0.5cc Celestone soluspan injected into the bilateral heels.  3. Continue wearing good shoes.  4. Return to clinic as needed with Dr. Milinda Pointer.     Edrick Kins, DPM [facility]  Dr. Edrick Kins, DPM    [address], [place] [address]                Office [phone]  Fax [phone]

## 2018-04-14 ENCOUNTER — Ambulatory Visit: Payer: Medicare Other | Admitting: Podiatry

## 2018-07-01 ENCOUNTER — Encounter: Payer: Self-pay | Admitting: Podiatry

## 2018-07-01 ENCOUNTER — Ambulatory Visit (INDEPENDENT_AMBULATORY_CARE_PROVIDER_SITE_OTHER): Payer: Medicare Other | Admitting: Podiatry

## 2018-07-01 DIAGNOSIS — M722 Plantar fascial fibromatosis: Secondary | ICD-10-CM

## 2018-07-01 NOTE — Progress Notes (Signed)
He presents today chief complaint of bilateral heel pain he is also having pain to the lateral aspect fifth metatarsal of the right foot.  Objective: Vital signs stable he is alert and oriented x3.  Pulses palpable.  Neurologic sensorium is diminished due to neuropathy.  There he still has pain on palpation medial calcaneal tubercles bilateral.  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  Plan: Discussed etiology pathology conservative versus surgical therapies.  After sterile Betadine skin prep I injected 20 mg Kenalog 5 mg Marcaine point maximal tenderness bilateral heel.  Tolerated seizure well.

## 2018-07-07 ENCOUNTER — Other Ambulatory Visit: Payer: Self-pay

## 2018-07-07 ENCOUNTER — Inpatient Hospital Stay
Admission: EM | Admit: 2018-07-07 | Discharge: 2018-07-09 | DRG: 282 | Disposition: A | Discharge status: Home / Self Care | Payer: Medicare Other | Attending: Internal Medicine | Admitting: Internal Medicine

## 2018-07-07 ENCOUNTER — Encounter: Payer: Self-pay | Admitting: Emergency Medicine

## 2018-07-07 ENCOUNTER — Emergency Department: Payer: Medicare Other

## 2018-07-07 DIAGNOSIS — Z841 Family history of disorders of kidney and ureter: Secondary | ICD-10-CM | POA: Diagnosis not present

## 2018-07-07 DIAGNOSIS — E291 Testicular hypofunction: Secondary | ICD-10-CM | POA: Diagnosis present

## 2018-07-07 DIAGNOSIS — Z7983 Long term (current) use of bisphosphonates: Secondary | ICD-10-CM

## 2018-07-07 DIAGNOSIS — Z888 Allergy status to other drugs, medicaments and biological substances status: Secondary | ICD-10-CM

## 2018-07-07 DIAGNOSIS — I25118 Atherosclerotic heart disease of native coronary artery with other forms of angina pectoris: Secondary | ICD-10-CM | POA: Diagnosis present

## 2018-07-07 DIAGNOSIS — E039 Hypothyroidism, unspecified: Secondary | ICD-10-CM | POA: Diagnosis present

## 2018-07-07 DIAGNOSIS — R778 Other specified abnormalities of plasma proteins: Secondary | ICD-10-CM

## 2018-07-07 DIAGNOSIS — I214 Non-ST elevation (NSTEMI) myocardial infarction: Secondary | ICD-10-CM | POA: Diagnosis present

## 2018-07-07 DIAGNOSIS — G894 Chronic pain syndrome: Secondary | ICD-10-CM | POA: Diagnosis present

## 2018-07-07 DIAGNOSIS — M109 Gout, unspecified: Secondary | ICD-10-CM | POA: Diagnosis present

## 2018-07-07 DIAGNOSIS — Z79899 Other long term (current) drug therapy: Secondary | ICD-10-CM

## 2018-07-07 DIAGNOSIS — Z833 Family history of diabetes mellitus: Secondary | ICD-10-CM | POA: Diagnosis not present

## 2018-07-07 DIAGNOSIS — N400 Enlarged prostate without lower urinary tract symptoms: Secondary | ICD-10-CM | POA: Diagnosis present

## 2018-07-07 DIAGNOSIS — E785 Hyperlipidemia, unspecified: Secondary | ICD-10-CM | POA: Diagnosis present

## 2018-07-07 DIAGNOSIS — E1142 Type 2 diabetes mellitus with diabetic polyneuropathy: Secondary | ICD-10-CM | POA: Diagnosis present

## 2018-07-07 DIAGNOSIS — Z79890 Hormone replacement therapy (postmenopausal): Secondary | ICD-10-CM

## 2018-07-07 DIAGNOSIS — E1159 Type 2 diabetes mellitus with other circulatory complications: Secondary | ICD-10-CM | POA: Diagnosis not present

## 2018-07-07 DIAGNOSIS — G4700 Insomnia, unspecified: Secondary | ICD-10-CM | POA: Diagnosis present

## 2018-07-07 DIAGNOSIS — R7989 Other specified abnormal findings of blood chemistry: Secondary | ICD-10-CM

## 2018-07-07 DIAGNOSIS — M79605 Pain in left leg: Secondary | ICD-10-CM | POA: Diagnosis not present

## 2018-07-07 DIAGNOSIS — I10 Essential (primary) hypertension: Secondary | ICD-10-CM | POA: Diagnosis present

## 2018-07-07 DIAGNOSIS — M25511 Pain in right shoulder: Secondary | ICD-10-CM

## 2018-07-07 DIAGNOSIS — Z91048 Other nonmedicinal substance allergy status: Secondary | ICD-10-CM

## 2018-07-07 DIAGNOSIS — Z7951 Long term (current) use of inhaled steroids: Secondary | ICD-10-CM

## 2018-07-07 DIAGNOSIS — R079 Chest pain, unspecified: Secondary | ICD-10-CM | POA: Diagnosis not present

## 2018-07-07 DIAGNOSIS — Z7984 Long term (current) use of oral hypoglycemic drugs: Secondary | ICD-10-CM | POA: Diagnosis not present

## 2018-07-07 DIAGNOSIS — I2510 Atherosclerotic heart disease of native coronary artery without angina pectoris: Secondary | ICD-10-CM | POA: Diagnosis not present

## 2018-07-07 DIAGNOSIS — E782 Mixed hyperlipidemia: Secondary | ICD-10-CM | POA: Diagnosis not present

## 2018-07-07 HISTORY — DX: Type 2 diabetes mellitus without complications: E11.9

## 2018-07-07 LAB — LIPID PANEL
Cholesterol: 234 mg/dL — ABNORMAL HIGH (ref 0–200)
HDL: 46 mg/dL (ref >40)
LDL Cholesterol: 162 mg/dL — ABNORMAL HIGH (ref 0–99)
TRIGLYCERIDES: 131 mg/dL (ref <150)
Total CHOL/HDL Ratio: 5.1 RATIO
VLDL: 26 mg/dL (ref 0–40)

## 2018-07-07 LAB — BASIC METABOLIC PANEL
Anion gap: 10 (ref 5–15)
BUN: 18 mg/dL (ref 8–23)
CO2: 25 mmol/L (ref 22–32)
CREATININE: 1.24 mg/dL (ref 0.61–1.24)
Calcium: 10.1 mg/dL (ref 8.9–10.3)
Chloride: 99 mmol/L (ref 98–111)
GFR calc Af Amer: >60 mL/min (ref >60)
GFR calc non Af Amer: 59 mL/min — ABNORMAL LOW (ref >60)
Glucose, Bld: 167 mg/dL — ABNORMAL HIGH (ref 70–99)
Potassium: 4.2 mmol/L (ref 3.5–5.1)
Sodium: 134 mmol/L — ABNORMAL LOW (ref 135–145)

## 2018-07-07 LAB — CBC
HCT: 45.5 % (ref 39.0–52.0)
Hemoglobin: 14.9 g/dL (ref 13.0–17.0)
MCH: 29 pg (ref 26.0–34.0)
MCHC: 32.7 g/dL (ref 30.0–36.0)
MCV: 88.7 fL (ref 80.0–100.0)
Platelets: 219 10*3/uL (ref 150–400)
RBC: 5.13 MIL/uL (ref 4.22–5.81)
RDW: 13.5 % (ref 11.5–15.5)
WBC: 4.6 10*3/uL (ref 4.0–10.5)
nRBC: 0 % (ref 0.0–0.2)

## 2018-07-07 LAB — PROTIME-INR
INR: 1.01
Prothrombin Time: 13.2 seconds (ref 11.4–15.2)

## 2018-07-07 LAB — APTT: aPTT: 99 seconds — ABNORMAL HIGH (ref 24–36)

## 2018-07-07 LAB — TROPONIN I
Troponin I: 0.03 ng/mL (ref <0.03)
Troponin I: 0.14 ng/mL (ref <0.03)
Troponin I: 0.75 ng/mL (ref <0.03)

## 2018-07-07 MED ORDER — CARVEDILOL 3.125 MG PO TABS
3.1250 mg | ORAL_TABLET | Freq: Two times a day (BID) | ORAL | Status: DC
  Administered 2018-07-08 – 2018-07-09 (×4): 3.125 mg via ORAL
  Filled 2018-07-07 (×4): qty 1

## 2018-07-07 MED ORDER — HEPARIN (PORCINE) 25000 UT/250ML-% IV SOLN
1100.0000 [IU]/h | INTRAVENOUS | Status: DC
  Administered 2018-07-07 – 2018-07-09 (×3): 1100 [IU]/h via INTRAVENOUS
  Filled 2018-07-07 (×4): qty 250

## 2018-07-07 MED ORDER — NITROGLYCERIN 0.4 MG SL SUBL: 0.4000 mg | SUBLINGUAL_TABLET | SUBLINGUAL | Status: DC | PRN

## 2018-07-07 MED ORDER — IOHEXOL 350 MG/ML SOLN
75.0000 mL | Freq: Once | INTRAVENOUS | Status: AC | PRN
  Administered 2018-07-07: 75 mL via INTRAVENOUS

## 2018-07-07 MED ORDER — MORPHINE SULFATE (PF) 4 MG/ML IV SOLN
4.0000 mg | INTRAVENOUS | Status: DC | PRN
  Administered 2018-07-08 (×2): 4 mg via INTRAVENOUS
  Filled 2018-07-07 (×2): qty 1

## 2018-07-07 MED ORDER — NITROGLYCERIN 2 % TD OINT
1.0000 [in_us] | TOPICAL_OINTMENT | Freq: Once | TRANSDERMAL | Status: AC
  Administered 2018-07-07: 1 [in_us] via TOPICAL
  Filled 2018-07-07: qty 1

## 2018-07-07 MED ORDER — HEPARIN BOLUS VIA INFUSION
4000.0000 [IU] | Freq: Once | INTRAVENOUS | Status: AC
  Administered 2018-07-07: 4000 [IU] via INTRAVENOUS
  Filled 2018-07-07: qty 4000

## 2018-07-07 MED ORDER — ASPIRIN 81 MG PO CHEW
324.0000 mg | CHEWABLE_TABLET | Freq: Once | ORAL | Status: AC
  Administered 2018-07-07: 324 mg via ORAL
  Filled 2018-07-07: qty 4

## 2018-07-07 MED ORDER — ASPIRIN EC 81 MG PO TBEC
81.0000 mg | DELAYED_RELEASE_TABLET | Freq: Every day | ORAL | Status: DC
  Administered 2018-07-08: 81 mg via ORAL
  Filled 2018-07-07 (×2): qty 1

## 2018-07-07 MED ORDER — SODIUM CHLORIDE 0.9 % IV BOLUS
500.0000 mL | Freq: Once | INTRAVENOUS | Status: AC
  Administered 2018-07-07: 500 mL via INTRAVENOUS

## 2018-07-07 MED ORDER — METOPROLOL TARTRATE 5 MG/5ML IV SOLN
2.5000 mg | Freq: Once | INTRAVENOUS | Status: AC
  Administered 2018-07-07: 2.5 mg via INTRAVENOUS
  Filled 2018-07-07: qty 5

## 2018-07-07 NOTE — Progress Notes (Signed)
Family Meeting Note  Advance Directive:no  Today a meeting took place with the Patient.   The following clinical team members were present during this meeting:MD  The following were discussed:Patient's diagnosis: Non-ST elevation MI, hypertension, hyperlipidemia, Patient's progosis: Unable to determine and Goals for treatment: Full Code  Additional follow-up to be provided: Cardiology  Time spent during discussion:20 minutes  Vaughan Basta, MD

## 2018-07-07 NOTE — Consult Note (Signed)
ANTICOAGULATION CONSULT NOTE - Initial Consult  Pharmacy Consult for heparin drip management Indication: chest pain/ACS  Patient Measurements: Height: 5\' 6"  (167.6 cm) Weight: 210 lb (95.3 kg) IBW/kg (Calculated) : 63.8 Heparin Dosing Weight: 84 kg  Vital Signs: Temp: 97.5 F (36.4 C) (02/17 1336) Temp Source: Oral (02/17 1336) BP: 147/96 (02/17 1808) Pulse Rate: 85 (02/17 1808)  Labs: Recent Labs    07/07/18 1343 07/07/18 1633  HGB 14.9  --   HCT 45.5  --   PLT 219  --   CREATININE 1.24  --   TROPONINI 0.03* 0.14*    Estimated Creatinine Clearance: 60.8 mL/min (by C-G formula based on SCr of 1.24 mg/dL).   Medical History: Past Medical History:  Diagnosis Date  . BPH (benign prostatic hyperplasia)   . Diabetes mellitus without complication ([place])   . Diverticulosis   . Dysuria   . History of kidney stones   . HLD (hyperlipidemia)   . Hx of gout   . Hydronephrosis   . Hypertension   . Hypothyroidism   . Insomnia   . Insomnia   . Marginal ulcers   . Neuromuscular disorder ([place])   . Nocturia   . Over weight   . Prostatitis   . Renal stones   . Testicular hypofunction   . Thyroid disease   . Ulcer   . Urinary frequency    Assessment: 25 YOM presents to the ed with chest pain. Initial troponin elevated at 0.14. He is on no chronic anticoagulation PTA. Baseline labs have been ordered but not yet resulted.  Goal of Therapy:  Heparin level 0.3-0.7 units/ml Monitor platelets by anticoagulation protocol: Yes   Plan:  Give 4000 units bolus x 1 Start heparin infusion at 1100 units/hr Check anti-Xa level in 6 hours and daily while on heparin Continue to monitor H&H and platelets  Dallie Piles 07/07/2018,6:42 PM

## 2018-07-07 NOTE — ED Provider Notes (Addendum)
[facility] Emergency Department Provider Note    First MD Initiated Contact with Patient 07/07/18 1726     (approximate)  I have reviewed the triage vital signs and the nursing notes.   HISTORY  Chief Complaint Chest Pain    HPI Troy Rojas is a 68 y.o. male below listed past medical history presents to the ER for evaluation of initially right wrist and hand pain radiating up into his right shoulder and chest.  Felt like it was a squeezing shooting pain.  Arrives the ER mildly tachycardic and hypertensive.  Denied any diaphoresis.  No shortness of breath or pleuritic pain.  No recent fevers.  States his been compliant with his medications.  Denies any history of CAD.  States that he was simply watching TV in a recliner when the pain started.    Past Medical History:  Diagnosis Date  . BPH (benign prostatic hyperplasia)   . Diabetes mellitus without complication ([place])   . Diverticulosis   . Dysuria   . History of kidney stones   . HLD (hyperlipidemia)   . Hx of gout   . Hydronephrosis   . Hypertension   . Hypothyroidism   . Insomnia   . Insomnia   . Marginal ulcers   . Neuromuscular disorder ([place])   . Nocturia   . Over weight   . Prostatitis   . Renal stones   . Testicular hypofunction   . Thyroid disease   . Ulcer   . Urinary frequency    Family History  Problem Relation Age of Onset  . Kidney disease Mother   . Diabetes type II Mother   . Prostate cancer Neg Hx    Past Surgical History:  Procedure Laterality Date  . APPENDECTOMY    . HEMORRHOID SURGERY    . LITHOTRIPSY     x 3  . NASAL SINUS SURGERY    . STOMACH SURGERY    . VASECTOMY     Patient Active Problem List   Diagnosis Date Noted  . Essential hypertension 09/03/2016  . Other and unspecified hyperlipidemia 09/03/2016  . Pain in both lower extremities 09/03/2016  . Gross hematuria 12/21/2014  . Suprapubic pain, acute 12/21/2014  . Pain medication agreement  signed 06/24/2014  . Benign neoplasm of colon, unspecified 10/20/2013  . Other testicular hypofunction 10/20/2013  . Hypothyroidism 10/20/2013  . Peripheral neuropathy 04/10/2013  . Chronic pain syndrome 09/07/2011  . Abdominal pain 03/12/2011      Prior to Admission medications   Medication Sig Start Date End Date Taking? Authorizing Provider  ACCU-CHEK FASTCLIX LANCETS [place]  09/27/16   [provider]  ACCU-CHEK GUIDE test strip  09/27/16   [provider]  allopurinol (ZYLOPRIM) 300 MG tablet Take 1 tablet by mouth daily. 11/14/15   [provider]  amLODipine (NORVASC) 10 MG tablet  03/28/18   [provider]  Cetirizine HCl 10 MG CAPS Take 1 tablet by mouth daily as needed.    [provider]  docusate sodium (COLACE) 100 MG capsule Take 1 capsule by mouth daily as needed.    [provider]  fenofibrate micronized (LOFIBRA) 134 MG capsule Take 1 capsule by mouth at bedtime. 11/14/15   [provider]  finasteride (PROSCAR) 5 MG tablet Take 1 tablet (5 mg total) by mouth daily. 01/25/15   McGowan, Larene Beach A, PA-C  Flaxseed, Linseed, (FLAXSEED OIL) OIL Take 1 capsule by mouth daily.    [provider]  fluticasone (FLONASE) 50 MCG/ACT nasal spray Place into the nose. 10/11/14   [provider]  gabapentin (NEURONTIN) 300 MG capsule Take 1 capsule by mouth 3 (three) times daily.    [provider]  gabapentin (NEURONTIN) 600 MG tablet  11/15/16   [provider]  levothyroxine (SYNTHROID, LEVOTHROID) 125 MCG tablet Take 1 tablet by mouth daily. 12/22/15   [provider]  loratadine-pseudoephedrine (CLARITIN-D 24 HOUR) 10-240 MG 24 hr tablet Take 1 tablet by mouth daily as needed.    [provider]  Melatonin 5 MG TABS Take 1 tablet by mouth daily.    [provider]  NONFORMULARY OR COMPOUNDED ITEM Combination Pain Cream-[facility] 2 refills    [provider]  nortriptyline (PAMELOR) 25 MG capsule Take 1 capsule by mouth daily. 07/01/13   [provider]  omega-3 acid ethyl esters (LOVAZA) 1 g capsule Take 1 capsule by mouth 2 (two) times daily. 09/02/17   [provider]  Oxycodone HCl 10 MG TABS Take 1 tablet by mouth every 6 (six) hours as needed. 06/25/16   [provider]  pantoprazole (PROTONIX) 40 MG tablet Take 1 tablet by mouth daily. 04/30/16   [provider]  senna-docusate (SENOKOT-S) 8.6-50 MG tablet Take 1 tablet by mouth daily as needed. 04/21/12   [provider]  tamsulosin (FLOMAX) 0.4 MG CAPS capsule TAKE 1 CAPSULE BY MOUTH ONCE A DAY 06/20/15   McGowan, Larene Beach A, PA-C  zolpidem (AMBIEN) 10 MG tablet Take 1 tablet by mouth 3 times/day as needed-between meals & bedtime. 06/29/16   [provider]    Allergies Amitriptyline; Benazepril; Duloxetine hcl; and Pollen extract    Social History Social History   Tobacco Use  . Smoking status: Never Smoker  . Smokeless tobacco: Never Used  Substance Use Topics  . Alcohol use: No  . Drug use: No    Review of Systems Patient denies headaches, rhinorrhea, blurry vision, numbness, shortness of breath, chest pain, edema, cough, abdominal pain, nausea, vomiting, diarrhea, dysuria, fevers, rashes or hallucinations unless otherwise stated above in HPI. ____________________________________________   PHYSICAL EXAM:  VITAL SIGNS: Vitals:   07/07/18 1740 07/07/18 1808  BP: (!) 154/85 (!) 147/96  Pulse:  85  Resp: 15 20  Temp:    SpO2:  96%    Constitutional: Alert and oriented.  Eyes: Conjunctivae are normal.  Head: Atraumatic. Nose: No congestion/rhinnorhea. Mouth/Throat: Mucous membranes are moist.   Neck: No stridor. Painless ROM.  Cardiovascular: Normal rate, regular rhythm. Grossly normal heart sounds.  Good peripheral circulation. Respiratory: Normal respiratory effort.  No retractions. Lungs  CTAB. Gastrointestinal: Soft and nontender. No distention. No abdominal bruits. No CVA tenderness. Genitourinary:  Musculoskeletal: No lower extremity tenderness nor edema.  No joint effusions. Neurologic:  Normal speech and language. No gross focal neurologic deficits are appreciated. No facial droop Skin:  Skin is warm, dry and intact. No rash noted. Psychiatric: Mood and affect are normal. Speech and behavior are normal.  ____________________________________________   LABS (all labs ordered are listed, but only abnormal results are displayed)  Results for orders placed or performed during the hospital encounter of 07/07/18 (from the past 24 hour(s))  Basic metabolic panel     Status: Abnormal   Collection Time: 07/07/18  1:43 PM  Result Value Ref Range   Sodium 134 (L) 135 - 145 mmol/L   Potassium 4.2 3.5 - 5.1 mmol/L   Chloride 99 98 - 111 mmol/L   CO2  25 22 - 32 mmol/L   Glucose, Bld 167 (H) 70 - 99 mg/dL   BUN 18 8 - 23 mg/dL   Creatinine, Ser 1.24 0.61 - 1.24 mg/dL   Calcium 10.1 8.9 - 10.3 mg/dL   GFR calc non Af Amer 59 (L) >60 mL/min   GFR calc Af Amer >60 >60 mL/min   Anion gap 10 5 - 15  CBC     Status: None   Collection Time: 07/07/18  1:43 PM  Result Value Ref Range   WBC 4.6 4.0 - 10.5 K/uL   RBC 5.13 4.22 - 5.81 MIL/uL   Hemoglobin 14.9 13.0 - 17.0 g/dL   HCT 45.5 39.0 - 52.0 %   MCV 88.7 80.0 - 100.0 fL   MCH 29.0 26.0 - 34.0 pg   MCHC 32.7 30.0 - 36.0 g/dL   RDW 13.5 11.5 - 15.5 %   Platelets 219 150 - 400 K/uL   nRBC 0.0 0.0 - 0.2 %  Troponin I - ONCE - STAT     Status: Abnormal   Collection Time: 07/07/18  1:43 PM  Result Value Ref Range   Troponin I 0.03 (HH) <0.03 ng/mL  Troponin I - ONCE - STAT     Status: Abnormal   Collection Time: 07/07/18  4:33 PM  Result Value Ref Range   Troponin I 0.14 (HH) <0.03 ng/mL   ____________________________________________  EKG My review and personal interpretation at Time: 13:34   Indication: shoulder pain   Rate: 110  Rhythm: sinus Axis: normal Other: nonspecific st abn, no stemi ____________________________________________  RADIOLOGY  I personally reviewed all radiographic images ordered to evaluate for the above acute complaints and reviewed radiology reports and findings.  These findings were personally discussed with the patient.  Please see medical record for radiology report.  ____________________________________________   PROCEDURES  Procedure(s) performed:  .Critical Care Performed by: Merlyn Lot, MD Authorized by: Merlyn Lot, MD   Critical care provider statement:    Critical care time (minutes):  30   Critical care time was exclusive of:  Separately billable procedures and treating other patients   Critical care was necessary to treat or prevent imminent or life-threatening deterioration of the following conditions:  Cardiac failure   Critical care was time spent personally by me on the following activities:  Development of treatment plan with patient or surrogate, discussions with consultants, evaluation of patient's response to treatment, examination of patient, obtaining history from patient or surrogate, ordering and performing treatments and interventions, ordering and review of laboratory studies, ordering and review of radiographic studies, pulse oximetry, re-evaluation of patient's condition and review of old charts      Critical Care performed: yes ____________________________________________   INITIAL IMPRESSION / [place] / ED COURSE  Pertinent labs & imaging results that were available during my care of the patient were reviewed by me and considered in my medical decision making (see chart for details).   DDX: ACS, pericarditis, esophagitis, boerhaaves, pe, dissection, pna, bronchitis, costochondritis   Troy Rojas is a 68 y.o. who presents to the ED with symptoms as described above.  Patient currently well-appearing but does  describe some mild right shoulder pain and given the pain shooting down to his right arm with hypertension and tachycardia I do feel that CT angiogram is likely needed.  Initial troponin is equivocal.  EKG does show sinus tachycardia.  Does not seem clinically consistent with PE.  Clinical Course as of Jul 07 1852  Mon Jul 07, 2018  1757 Discussed results of elevated troponin with patient and plan for admission to the hospital however do feel the patient will require CT angiogram to exclude dissection as etiology of his chest discomfort.   [PR]  3967 Will initiate aspirin and heparinization after dissection is ruled out.   [PR]  2897 CT imaging by my read does not show any evidence of dissection therefore will initiate heparin, aspirin and aggressive blood pressure management with nitroglycerin.   [PR]  1854 Discussed case with Dr. Fletcher Anon of cardiology who agrees with plan.   [PR]    Clinical Course User Index [PR] Merlyn Lot, MD     As part of my medical decision making, I reviewed the following data within the [place] notes reviewed and incorporated, Labs reviewed, notes from prior ED visits and [place] Controlled Substance Database   ____________________________________________   FINAL CLINICAL IMPRESSION(S) / ED DIAGNOSES  Final diagnoses:  Acute pain of right shoulder  Elevated troponin I level      NEW MEDICATIONS STARTED DURING THIS VISIT:  New Prescriptions   No medications on file     Note:  This document was prepared using Dragon voice recognition software and may include unintentional dictation errors.    Merlyn Lot, MD 07/07/18 [place]    Merlyn Lot, MD 07/07/18 [phone]

## 2018-07-07 NOTE — H&P (Signed)
[place] [facility] [place] NAME: Troy Rojas    MR#:  [id number]  DATE OF BIRTH:  01/23/1951  DATE OF ADMISSION:  07/07/2018  PRIMARY CARE PHYSICIAN: Maryland Pink, MD   REQUESTING/REFERRING PHYSICIAN: Quentin Cornwall  CHIEF COMPLAINT:   Chief Complaint  Patient presents with  . Chest Pain    HISTORY OF PRESENT ILLNESS: Troy Rojas  is a 68 y.o. male with a known history of BPH, diabetes, diverticulosis, hyperlipidemia, hydronephrosis, hypertension, hypothyroidism, insomnia, renal stone-started having chest pain which was radiating to his right arm and hand.  The pain was pressure-like and started today.  He was worried about the heart and came to emergency room.  His pain had some relief after getting initial treatment in emergency room.  His troponin was noted to be elevated but no EKG changes so ER physician spoke to cardiologist who suggested to start on heparin IV drip.  PAST MEDICAL HISTORY:   Past Medical History:  Diagnosis Date  . BPH (benign prostatic hyperplasia)   . Diabetes mellitus without complication ([place])   . Diverticulosis   . Dysuria   . History of kidney stones   . HLD (hyperlipidemia)   . Hx of gout   . Hydronephrosis   . Hypertension   . Hypothyroidism   . Insomnia   . Insomnia   . Marginal ulcers   . Neuromuscular disorder ([place])   . Nocturia   . Over weight   . Prostatitis   . Renal stones   . Testicular hypofunction   . Thyroid disease   . Ulcer   . Urinary frequency     PAST SURGICAL HISTORY:  Past Surgical History:  Procedure Laterality Date  . APPENDECTOMY    . HEMORRHOID SURGERY    . LITHOTRIPSY     x 3  . NASAL SINUS SURGERY    . STOMACH SURGERY    . VASECTOMY      SOCIAL HISTORY:  Social History   Tobacco Use  . Smoking status: Never Smoker  . Smokeless tobacco: Never Used  Substance Use Topics  . Alcohol use: No    FAMILY HISTORY:  Family History  Problem Relation Age of Onset  .  Kidney disease Mother   . Diabetes type II Mother   . Prostate cancer Neg Hx     DRUG ALLERGIES:  Allergies  Allergen Reactions  . Amitriptyline     Reaction unknown  . Benazepril Other (See Comments)    Other reaction(s): Other (See Comments) TONGUE SWELLING TONGUE SWELLING TONGUE SWELLING  . Duloxetine Hcl     Reaction unkown  . Pollen Extract Other (See Comments)    Sinus issues    REVIEW OF SYSTEMS:   CONSTITUTIONAL: No fever, fatigue or weakness.  EYES: No blurred or double vision.  EARS, NOSE, AND THROAT: No tinnitus or ear pain.  RESPIRATORY: No cough, shortness of breath, wheezing or hemoptysis.  CARDIOVASCULAR: He had chest pain, no orthopnea, edema.  GASTROINTESTINAL: No nausea, vomiting, diarrhea or abdominal pain.  GENITOURINARY: No dysuria, hematuria.  ENDOCRINE: No polyuria, nocturia,  HEMATOLOGY: No anemia, easy bruising or bleeding SKIN: No rash or lesion. MUSCULOSKELETAL: No joint pain or arthritis.   NEUROLOGIC: No tingling, numbness, weakness.  PSYCHIATRY: No anxiety or depression.   MEDICATIONS AT HOME:  Prior to Admission medications   Medication Sig Start Date End Date Taking? Authorizing Provider  allopurinol (ZYLOPRIM) 300 MG tablet Take 1 tablet by mouth daily. 11/14/15   [provider]  amLODipine (NORVASC) 10 MG tablet  03/28/18   [provider]  Cetirizine HCl 10 MG CAPS Take 1 tablet by mouth daily as needed.    [provider]  docusate sodium (COLACE) 100 MG capsule Take 1 capsule by mouth daily as needed.    [provider]  fenofibrate micronized (LOFIBRA) 134 MG capsule Take 1 capsule by mouth at bedtime. 11/14/15   [provider]  finasteride (PROSCAR) 5 MG tablet Take 1 tablet (5 mg total) by mouth daily. 01/25/15   McGowan, Larene Beach A, PA-C  Flaxseed, Linseed, (FLAXSEED OIL) OIL Take 1 capsule by mouth daily.    [provider]  fluticasone (FLONASE) 50 MCG/ACT nasal spray Place into  the nose. 10/11/14   [provider]  gabapentin (NEURONTIN) 300 MG capsule Take 1 capsule by mouth 3 (three) times daily.    [provider]  gabapentin (NEURONTIN) 600 MG tablet  11/15/16   [provider]  levothyroxine (SYNTHROID, LEVOTHROID) 125 MCG tablet Take 1 tablet by mouth daily. 12/22/15   [provider]  loratadine-pseudoephedrine (CLARITIN-D 24 HOUR) 10-240 MG 24 hr tablet Take 1 tablet by mouth daily as needed.    [provider]  Melatonin 5 MG TABS Take 1 tablet by mouth daily.    [provider]  NONFORMULARY OR COMPOUNDED ITEM Combination Pain Cream-[facility] 2 refills    [provider]  nortriptyline (PAMELOR) 25 MG capsule Take 1 capsule by mouth daily. 07/01/13   [provider]  omega-3 acid ethyl esters (LOVAZA) 1 g capsule Take 1 capsule by mouth 2 (two) times daily. 09/02/17   [provider]  Oxycodone HCl 10 MG TABS Take 1 tablet by mouth every 6 (six) hours as needed. 06/25/16   [provider]  pantoprazole (PROTONIX) 40 MG tablet Take 1 tablet by mouth daily. 04/30/16   [provider]  senna-docusate (SENOKOT-S) 8.6-50 MG tablet Take 1 tablet by mouth daily as needed. 04/21/12   [provider]  tamsulosin (FLOMAX) 0.4 MG CAPS capsule TAKE 1 CAPSULE BY MOUTH ONCE A DAY 06/20/15   McGowan, Larene Beach A, PA-C  zolpidem (AMBIEN) 10 MG tablet Take 1 tablet by mouth 3 times/day as needed-between meals & bedtime. 06/29/16   [provider]      PHYSICAL EXAMINATION:   VITAL SIGNS: Blood pressure (!) 147/96, pulse 85, temperature (!) 97.5 F (36.4 C), temperature source Oral, resp. rate 20, height 5\' 6"  (1.676 m), weight 95.3 kg, SpO2 96 %.  GENERAL:  68 y.o.-year-old patient lying in the bed with no acute distress.  EYES: Pupils equal, round, reactive to light and accommodation. No scleral icterus. Extraocular muscles intact.  HEENT: Head atraumatic,  normocephalic. Oropharynx and nasopharynx clear.  NECK:  Supple, no jugular venous distention. No thyroid enlargement, no tenderness.  LUNGS: Normal breath sounds bilaterally, no wheezing, rales,rhonchi or crepitation. No use of accessory muscles of respiration.  CARDIOVASCULAR: S1, S2 normal. No murmurs, rubs, or gallops.  ABDOMEN: Soft, nontender, nondistended. Bowel sounds present. No organomegaly or mass.  EXTREMITIES: No pedal edema, cyanosis, or clubbing.  NEUROLOGIC: Cranial nerves II through XII are intact. Muscle strength 5/5 in all extremities. Sensation intact. Gait not checked.  PSYCHIATRIC: The patient is alert and oriented x 3.  SKIN: No obvious rash, lesion, or ulcer.   LABORATORY PANEL:   CBC Recent Labs  Lab 07/07/18 1343  WBC 4.6  HGB 14.9  HCT 45.5  PLT 219  MCV  88.7  MCH 29.0  MCHC 32.7  RDW 13.5   ------------------------------------------------------------------------------------------------------------------  Chemistries  Recent Labs  Lab 07/07/18 1343  NA 134*  K 4.2  CL 99  CO2 25  GLUCOSE 167*  BUN 18  CREATININE 1.24  CALCIUM 10.1   ------------------------------------------------------------------------------------------------------------------ estimated creatinine clearance is 60.8 mL/min (by C-G formula based on SCr of 1.24 mg/dL). ------------------------------------------------------------------------------------------------------------------ No results for input(s): TSH, T4TOTAL, T3FREE, THYROIDAB in the last 72 hours.  Invalid input(s): FREET3   Coagulation profile No results for input(s): INR, PROTIME in the last 168 hours. ------------------------------------------------------------------------------------------------------------------- No results for input(s): DDIMER in the last 72 hours. -------------------------------------------------------------------------------------------------------------------  Cardiac Enzymes Recent  Labs  Lab 07/07/18 1343 07/07/18 1633  TROPONINI 0.03* 0.14*   ------------------------------------------------------------------------------------------------------------------ Invalid input(s): POCBNP  ---------------------------------------------------------------------------------------------------------------  Urinalysis    Component Value Date/Time   COLORURINE Yellow 06/04/2012 1954   APPEARANCEUR Clear 01/25/2015 1441   LABSPEC 1.015 06/04/2012 1954   PHURINE 5.0 06/04/2012 1954   GLUCOSEU Negative 01/25/2015 1441   GLUCOSEU Negative 06/04/2012 1954   HGBUR 2+ 06/04/2012 1954   BILIRUBINUR Negative 01/25/2015 1441   BILIRUBINUR Negative 06/04/2012 1954   KETONESUR Negative 06/04/2012 1954   PROTEINUR Negative 01/25/2015 1441   PROTEINUR Negative 06/04/2012 1954   NITRITE Negative 01/25/2015 1441   NITRITE Negative 06/04/2012 1954   LEUKOCYTESUR Negative 01/25/2015 1441   LEUKOCYTESUR Negative 06/04/2012 1954     RADIOLOGY: Dg Chest 2 View  Result Date: 07/07/2018 CLINICAL DATA:  Chest pain EXAM: CHEST - 2 VIEW COMPARISON:  None. FINDINGS: The heart size and mediastinal contours are within normal limits. Both lungs are clear. The visualized skeletal structures are unremarkable. IMPRESSION: No active cardiopulmonary disease. Electronically Signed   By: Donavan Foil M.D.   On: 07/07/2018 15:28   Ct Angio Chest Aorta W And/or Wo Contrast  Result Date: 07/07/2018 CLINICAL DATA:  68 year old male with history of right-sided arm pain since 1 p.m. today. No associated nausea or vomiting. Pain in the right upper chest. No shortness of breath. EXAM: CT ANGIOGRAPHY CHEST WITH CONTRAST TECHNIQUE: Multidetector CT imaging of the chest was performed using the standard protocol during bolus administration of intravenous contrast. Multiplanar CT image reconstructions and MIPs were obtained to evaluate the vascular anatomy. CONTRAST:  67mL OMNIPAQUE IOHEXOL 350 MG/ML SOLN COMPARISON:   None. FINDINGS: Cardiovascular: Heart size is normal. There is no significant pericardial fluid, thickening or pericardial calcification. There is aortic atherosclerosis, as well as atherosclerosis of the great vessels of the mediastinum and the coronary arteries, including calcified atherosclerotic plaque in the left main, left anterior descending, left circumflex and right coronary arteries. Precontrast images demonstrate no crescentic high attenuation associated with the wall of the thoracic aorta to suggest acute intramural hemorrhage. No aneurysm or dissection of the thoracic aorta. Mediastinum/Nodes: No pathologically enlarged mediastinal or hilar lymph nodes. Esophagus is unremarkable in appearance. No axillary lymphadenopathy. Lungs/Pleura: No suspicious appearing pulmonary nodules or masses are noted. No acute consolidative airspace disease. No pleural effusions. Upper Abdomen: Low-attenuation lesions in the upper abdomen, incompletely imaged, but correlating to large renal cysts noted on prior CT the abdomen and pelvis 12/21/2014. Diffuse low attenuation throughout the visualized portions of the hepatic parenchyma, indicative of hepatic steatosis. Musculoskeletal: There are no aggressive appearing lytic or blastic lesions noted in the visualized portions of the skeleton. Review of the MIP images confirms the above findings. IMPRESSION: 1. No acute abnormality of the thoracic aorta. 2. Aortic atherosclerosis, in addition to left main and 3 vessel coronary  artery disease. Please note that although the presence of coronary artery calcium documents the presence of coronary artery disease, the severity of this disease and any potential stenosis cannot be assessed on this non-gated CT examination. Assessment for potential risk factor modification, dietary therapy or pharmacologic therapy may be warranted, if clinically indicated. 3. Hepatic steatosis. Aortic Atherosclerosis (ICD10-I70.0). Electronically Signed    By: Vinnie Langton M.D.   On: 07/07/2018 18:42    EKG: Orders placed or performed during the hospital encounter of 07/07/18  . EKG 12-Lead  . EKG 12-Lead  . ED EKG  . ED EKG    IMPRESSION AND PLAN:  *Non-ST elevation MI Heparin IV drip Monitor on telemetry and follow serial troponin Get echocardiogram Check lipid panel and hemoglobin A1c Cardiologist is informed and he approved starting on heparin drip for now.  *Hypertension Continue home medications. Adding carvedilol because of possible acute MI.  *Neuropathy Continue home meds.  *Hypothyroidism Continue levothyroxine.  *BPH Continue Flomax and finasteride.  All the records are reviewed and case discussed with ED provider. Management plans discussed with the patient, family and they are in agreement.  CODE STATUS: full.    TOTAL TIME TAKING CARE OF THIS PATIENT: 50 minutes.    Vaughan Basta M.D on 07/07/2018   Between 7am to 6pm - Pager - [phone]  After 6pm go to [email or web address] - password [email or web address] [facility]  Office  [phone]  CC: Primary care physician; [place] Pink, MD   Note: This dictation was prepared with Dragon dictation along with smaller phrase technology. Any transcriptional errors that result from this process are unintentional.

## 2018-07-07 NOTE — ED Triage Notes (Signed)
Says right side chest pain rad to right wrist for about 30 minutes.

## 2018-07-07 NOTE — ED Notes (Signed)
Spoke with pt about wait times and what to expect next. Advised pt that I am available for further questions if needed.  

## 2018-07-07 NOTE — ED Notes (Signed)
Patient transported to CT 

## 2018-07-07 NOTE — ED Notes (Signed)
Patient considering leaving. Discussed need for repeat troponin at prescribed time and patient agrees to wait and cont treatment.

## 2018-07-07 NOTE — ED Notes (Signed)
Iv started   Medsgiven,.  Pt alert.  nsr on monitor.

## 2018-07-07 NOTE — ED Notes (Signed)
Report off to henry rn  

## 2018-07-07 NOTE — ED Notes (Signed)
Pt  Continues to wait on admission bed.  nsr on monitor.

## 2018-07-07 NOTE — ED Notes (Signed)
Pt reports right arm pain in right arm since 1300 today.  No n/v   Pain in right upper chest   No sob. Nonsmoker.  Diabetic.  Pt alert  Speech clear.  Ambulates without diff.  Iv started   meds given.

## 2018-07-08 ENCOUNTER — Encounter: Payer: Self-pay | Admitting: Internal Medicine

## 2018-07-08 ENCOUNTER — Inpatient Hospital Stay (HOSPITAL_COMMUNITY)
Admit: 2018-07-08 | Discharge: 2018-07-08 | Disposition: A | Discharge status: Home / Self Care | Payer: Medicare Other | Attending: Internal Medicine | Admitting: Internal Medicine

## 2018-07-08 DIAGNOSIS — I10 Essential (primary) hypertension: Secondary | ICD-10-CM

## 2018-07-08 DIAGNOSIS — I214 Non-ST elevation (NSTEMI) myocardial infarction: Principal | ICD-10-CM

## 2018-07-08 DIAGNOSIS — E782 Mixed hyperlipidemia: Secondary | ICD-10-CM

## 2018-07-08 DIAGNOSIS — R079 Chest pain, unspecified: Secondary | ICD-10-CM

## 2018-07-08 DIAGNOSIS — E1159 Type 2 diabetes mellitus with other circulatory complications: Secondary | ICD-10-CM

## 2018-07-08 LAB — ECHOCARDIOGRAM COMPLETE
HEIGHTINCHES: 66 in
Weight: 3360 oz

## 2018-07-08 LAB — BASIC METABOLIC PANEL
Anion gap: 6 (ref 5–15)
BUN: 17 mg/dL (ref 8–23)
CO2: 27 mmol/L (ref 22–32)
Calcium: 9.3 mg/dL (ref 8.9–10.3)
Chloride: 102 mmol/L (ref 98–111)
Creatinine, Ser: 1.17 mg/dL (ref 0.61–1.24)
GFR calc Af Amer: >60 mL/min (ref >60)
GFR calc non Af Amer: >60 mL/min (ref >60)
Glucose, Bld: 137 mg/dL — ABNORMAL HIGH (ref 70–99)
Potassium: 3.9 mmol/L (ref 3.5–5.1)
Sodium: 135 mmol/L (ref 135–145)

## 2018-07-08 LAB — HEPARIN LEVEL (UNFRACTIONATED)
Heparin Unfractionated: 0.5 IU/mL (ref 0.30–0.70)
Heparin Unfractionated: 0.48 IU/mL (ref 0.30–0.70)

## 2018-07-08 LAB — CBC
HCT: 42.5 % (ref 39.0–52.0)
Hemoglobin: 14 g/dL (ref 13.0–17.0)
MCH: 29 pg (ref 26.0–34.0)
MCHC: 32.9 g/dL (ref 30.0–36.0)
MCV: 88.2 fL (ref 80.0–100.0)
Platelets: 201 10*3/uL (ref 150–400)
RBC: 4.82 MIL/uL (ref 4.22–5.81)
RDW: 13.6 % (ref 11.5–15.5)
WBC: 4.8 10*3/uL (ref 4.0–10.5)
nRBC: 0 % (ref 0.0–0.2)

## 2018-07-08 LAB — TROPONIN I
Troponin I: 1.28 ng/mL (ref <0.03)
Troponin I: 1.07 ng/mL (ref <0.03)

## 2018-07-08 MED ORDER — ZOLPIDEM TARTRATE 5 MG PO TABS
5.0000 mg | ORAL_TABLET | Freq: Every evening | ORAL | Status: DC | PRN
  Administered 2018-07-08: 5 mg via ORAL
  Filled 2018-07-08: qty 1

## 2018-07-08 MED ORDER — PANTOPRAZOLE SODIUM 40 MG PO TBEC
40.0000 mg | DELAYED_RELEASE_TABLET | Freq: Every day | ORAL | Status: DC
  Administered 2018-07-08 – 2018-07-09 (×2): 40 mg via ORAL
  Filled 2018-07-08 (×2): qty 1

## 2018-07-08 MED ORDER — TAMSULOSIN HCL 0.4 MG PO CAPS
0.4000 mg | ORAL_CAPSULE | Freq: Every day | ORAL | Status: DC
  Administered 2018-07-08 – 2018-07-09 (×2): 0.4 mg via ORAL
  Filled 2018-07-08 (×2): qty 1

## 2018-07-08 MED ORDER — ALLOPURINOL 300 MG PO TABS
300.0000 mg | ORAL_TABLET | Freq: Every day | ORAL | Status: DC
  Administered 2018-07-08 – 2018-07-09 (×2): 300 mg via ORAL
  Filled 2018-07-08 (×2): qty 1

## 2018-07-08 MED ORDER — NORTRIPTYLINE HCL 25 MG PO CAPS
25.0000 mg | ORAL_CAPSULE | Freq: Every day | ORAL | Status: DC
  Administered 2018-07-08 – 2018-07-09 (×2): 25 mg via ORAL
  Filled 2018-07-08 (×2): qty 1

## 2018-07-08 MED ORDER — FLUTICASONE PROPIONATE 50 MCG/ACT NA SUSP
1.0000 | Freq: Every day | NASAL | Status: DC
  Administered 2018-07-08 – 2018-07-09 (×2): 1 via NASAL
  Filled 2018-07-08: qty 16

## 2018-07-08 MED ORDER — FENOFIBRATE 160 MG PO TABS
160.0000 mg | ORAL_TABLET | Freq: Every day | ORAL | Status: DC
  Administered 2018-07-08 – 2018-07-09 (×2): 160 mg via ORAL
  Filled 2018-07-08 (×2): qty 1

## 2018-07-08 MED ORDER — ATORVASTATIN CALCIUM 20 MG PO TABS
40.0000 mg | ORAL_TABLET | Freq: Every day | ORAL | Status: DC
  Administered 2018-07-08 – 2018-07-09 (×2): 40 mg via ORAL
  Filled 2018-07-08 (×2): qty 2

## 2018-07-08 MED ORDER — GABAPENTIN 300 MG PO CAPS
300.0000 mg | ORAL_CAPSULE | Freq: Three times a day (TID) | ORAL | Status: DC
  Administered 2018-07-08 – 2018-07-09 (×5): 300 mg via ORAL
  Filled 2018-07-08 (×5): qty 1

## 2018-07-08 MED ORDER — FLAXSEED OIL PO OIL: 1.0000 | TOPICAL_OIL | Freq: Every day | ORAL | Status: DC

## 2018-07-08 MED ORDER — LORATADINE 10 MG PO TABS
10.0000 mg | ORAL_TABLET | Freq: Every day | ORAL | Status: DC
  Administered 2018-07-08: 10 mg via ORAL
  Filled 2018-07-08 (×2): qty 1

## 2018-07-08 MED ORDER — MELATONIN 5 MG PO TABS
1.0000 | ORAL_TABLET | Freq: Every day | ORAL | Status: DC
  Filled 2018-07-08 (×2): qty 1

## 2018-07-08 MED ORDER — SALINE SPRAY 0.65 % NA SOLN
1.0000 | Freq: Once | NASAL | Status: AC
  Administered 2018-07-08: 1 via NASAL
  Filled 2018-07-08: qty 44

## 2018-07-08 MED ORDER — DOCUSATE SODIUM 100 MG PO CAPS: 100.0000 mg | ORAL_CAPSULE | Freq: Two times a day (BID) | ORAL | Status: DC | PRN

## 2018-07-08 MED ORDER — OXYCODONE HCL 5 MG PO TABS
10.0000 mg | ORAL_TABLET | Freq: Four times a day (QID) | ORAL | Status: DC | PRN
  Administered 2018-07-08 – 2018-07-09 (×4): 10 mg via ORAL
  Filled 2018-07-08 (×4): qty 2

## 2018-07-08 MED ORDER — FINASTERIDE 5 MG PO TABS
5.0000 mg | ORAL_TABLET | Freq: Every day | ORAL | Status: DC
  Administered 2018-07-08 – 2018-07-09 (×2): 5 mg via ORAL
  Filled 2018-07-08 (×2): qty 1

## 2018-07-08 MED ORDER — LEVOTHYROXINE SODIUM 25 MCG PO TABS
125.0000 ug | ORAL_TABLET | Freq: Every day | ORAL | Status: DC
  Administered 2018-07-08 – 2018-07-09 (×2): 125 ug via ORAL
  Filled 2018-07-08: qty 1
  Filled 2018-07-08: qty 3

## 2018-07-08 MED ORDER — AMLODIPINE BESYLATE 5 MG PO TABS
5.0000 mg | ORAL_TABLET | Freq: Every day | ORAL | Status: DC
  Administered 2018-07-08 – 2018-07-09 (×2): 5 mg via ORAL
  Filled 2018-07-08 (×2): qty 1

## 2018-07-08 MED ORDER — OMEGA-3-ACID ETHYL ESTERS 1 G PO CAPS
1.0000 | ORAL_CAPSULE | Freq: Two times a day (BID) | ORAL | Status: DC
  Administered 2018-07-08 – 2018-07-09 (×3): 1 g via ORAL
  Filled 2018-07-08 (×4): qty 1

## 2018-07-08 MED ORDER — DOCUSATE SODIUM 100 MG PO CAPS: 100.0000 mg | ORAL_CAPSULE | Freq: Every day | ORAL | Status: DC | PRN

## 2018-07-08 NOTE — ED Notes (Signed)
ED TO INPATIENT HANDOFF REPORT  ED Nurse Name and Phone #:  Jinny Blossom [place]  S Name/Age/Gender Blase Mess 68 y.o. male Room/Bed: ED03A/ED03A  Code Status   Code Status: Full Code  Home/SNF/Other Home Patient oriented to: self, place, time and situation Is this baseline? Yes   Triage Complete: Triage complete  Chief Complaint rt arm pain/cp  Triage Note Says right side chest pain rad to right wrist for about 30 minutes.     Allergies Allergies  Allergen Reactions  . Benazepril Other (See Comments)    Other reaction(s): Tongue swelling  . Amitriptyline Other (See Comments)    Reaction unknown  . Duloxetine Hcl Other (See Comments)    Reaction unkown  . Pollen Extract Other (See Comments)    Sinus issues    Level of Care/Admitting Diagnosis ED Disposition    ED Disposition Condition Comment   Admit  Hospital Area: [facility] [[id number]]  Level of Care: Telemetry [5]  Diagnosis: NSTEMI (non-ST elevated myocardial infarction) [facility]) [[id number]]  Admitting Physician: Vaughan Basta [phone]  Attending Physician: Vaughan Basta [phone]  Estimated length of stay: past midnight tomorrow  Certification:: I certify this patient will need inpatient services for at least 2 midnights  PT Class (Do Not Modify): Inpatient [101]  PT Acc Code (Do Not Modify): Private [1]       B Medical/Surgery History Past Medical History:  Diagnosis Date  . BPH (benign prostatic hyperplasia)   . Diabetes mellitus without complication ([place])   . Diverticulosis   . Dysuria   . History of kidney stones   . HLD (hyperlipidemia)   . Hx of gout   . Hydronephrosis   . Hypertension   . Hypothyroidism   . Insomnia   . Insomnia   . Marginal ulcers   . Neuromuscular disorder ([place])   . Nocturia   . Over weight   . Prostatitis   . Renal stones   . Testicular hypofunction   . Thyroid disease   . Ulcer   . Urinary frequency    Past Surgical  History:  Procedure Laterality Date  . APPENDECTOMY    . HEMORRHOID SURGERY    . LITHOTRIPSY     x 3  . NASAL SINUS SURGERY    . STOMACH SURGERY    . VASECTOMY       A IV Location/Drains/Wounds Patient Lines/Drains/Airways Status   Active Line/Drains/Airways    Name:   Placement date:   Placement time:   Site:   Days:   Peripheral IV 07/07/18 Right Antecubital   07/07/18    1803    Antecubital   1          Intake/Output Last 24 hours  Intake/Output Summary (Last 24 hours) at 07/08/2018 1712 Last data filed at 07/08/2018 1711 Gross per 24 hour  Intake 638.87 ml  Output 1650 ml  Net -1011.13 ml    Labs/Imaging Results for orders placed or performed during the hospital encounter of 07/07/18 (from the past 48 hour(s))  Basic metabolic panel     Status: Abnormal   Collection Time: 07/07/18  1:43 PM  Result Value Ref Range   Sodium 134 (L) 135 - 145 mmol/L   Potassium 4.2 3.5 - 5.1 mmol/L   Chloride 99 98 - 111 mmol/L   CO2 25 22 - 32 mmol/L   Glucose, Bld 167 (H) 70 - 99 mg/dL   BUN 18 8 - 23 mg/dL   Creatinine, Ser 1.24 0.61 -  1.24 mg/dL   Calcium 10.1 8.9 - 10.3 mg/dL   GFR calc non Af Amer 59 (L) >60 mL/min   GFR calc Af Amer >60 >60 mL/min   Anion gap 10 5 - 15    Comment: Performed at [facility], [address] [place], [place] [address]  CBC     Status: None   Collection Time: 07/07/18  1:43 PM  Result Value Ref Range   WBC 4.6 4.0 - 10.5 K/uL   RBC 5.13 4.22 - 5.81 MIL/uL   Hemoglobin 14.9 13.0 - 17.0 g/dL   HCT 45.5 39.0 - 52.0 %   MCV 88.7 80.0 - 100.0 fL   MCH 29.0 26.0 - 34.0 pg   MCHC 32.7 30.0 - 36.0 g/dL   RDW 13.5 11.5 - 15.5 %   Platelets 219 150 - 400 K/uL   nRBC 0.0 0.0 - 0.2 %    Comment: Performed at [facility], [address] [place], [place] [address]  Troponin I - ONCE - STAT     Status: Abnormal   Collection Time: 07/07/18  1:43 PM  Result Value Ref Range   Troponin I 0.03 (HH) <0.03 ng/mL    Comment:  CRITICAL RESULT CALLED TO, READ BACK BY AND VERIFIED WITH KIM GAULT AT 4696 ON 07/07/2018 JJB Performed at [facility], [address] [place], [place] [address]   Troponin I - ONCE - STAT     Status: Abnormal   Collection Time: 07/07/18  4:33 PM  Result Value Ref Range   Troponin I 0.14 (HH) <0.03 ng/mL    Comment: CRITICAL VALUE NOTED. VALUE IS CONSISTENT WITH PREVIOUSLY REPORTED/CALLED VALUE JJB Performed at [facility], [address] [place], [place] [address]   APTT     Status: Abnormal   Collection Time: 07/07/18  9:04 PM  Result Value Ref Range   aPTT 99 (H) 24 - 36 seconds    Comment:        IF BASELINE aPTT IS ELEVATED, SUGGEST PATIENT RISK ASSESSMENT BE USED TO DETERMINE APPROPRIATE ANTICOAGULANT THERAPY. Performed at [facility], [address] [place], [place] [address]   Protime-INR     Status: None   Collection Time: 07/07/18  9:04 PM  Result Value Ref Range   Prothrombin Time 13.2 11.4 - 15.2 seconds   INR 1.01     Comment: Performed at [facility], [address] [place], [place] [address]  Troponin I - Now Then Q6H     Status: Abnormal   Collection Time: 07/07/18  9:04 PM  Result Value Ref Range   Troponin I 0.75 (HH) <0.03 ng/mL    Comment: CRITICAL RESULT CALLED TO, READ BACK BY AND VERIFIED WITH AMY Pakula @2222  07/07/18 AKT Performed at [facility], [address] [place], [place] [address]   Lipid panel     Status: Abnormal   Collection Time: 07/07/18  9:04 PM  Result Value Ref Range   Cholesterol 234 (H) 0 - 200 mg/dL   Triglycerides 131 <150 mg/dL   HDL 46 >40 mg/dL   Total CHOL/HDL Ratio 5.1 RATIO   VLDL 26 0 - 40 mg/dL   LDL Cholesterol 162 (H) 0 - 99 mg/dL    Comment:        Total Cholesterol/HDL:CHD Risk Coronary Heart Disease Risk Table                     Men   Women  1/2 Average Risk   3.4   3.3  Average Risk       5.0   4.4  2 X Average Risk   9.6   7.1  3 X Average Risk  23.4    11.0        Use the calculated Patient Ratio above and the CHD Risk Table to determine the patient's CHD Risk.        ATP III CLASSIFICATION (LDL):  <100     mg/dL   Optimal  100-129  mg/dL   Near or Above                    Optimal  130-159  mg/dL   Borderline  160-189  mg/dL   High  >190     mg/dL   Very High Performed at [facility], [place], [place] [address]   Heparin level (unfractionated)     Status: None   Collection Time: 07/08/18  3:22 AM  Result Value Ref Range   Heparin Unfractionated 0.50 0.30 - 0.70 IU/mL    Comment: (NOTE) If heparin results are below expected values, and patient dosage has  been confirmed, suggest follow up testing of antithrombin III levels. Performed at [facility], [address] [place], [place] [address]   Troponin I - Now Then Q6H     Status: Abnormal   Collection Time: 07/08/18  3:22 AM  Result Value Ref Range   Troponin I 1.28 (HH) <0.03 ng/mL    Comment: CRITICAL VALUE NOTED. VALUE IS CONSISTENT WITH PREVIOUSLY REPORTED/CALLED VALUE SMA Performed at [facility], [address] [place], [place] [address]   Troponin I - Now Then Q6H     Status: Abnormal   Collection Time: 07/08/18  7:46 AM  Result Value Ref Range   Troponin I 1.07 (HH) <0.03 ng/mL    Comment: CRITICAL VALUE NOTED. VALUE IS CONSISTENT WITH PREVIOUSLY REPORTED/CALLED VALUE...[facility] Performed at [facility], [address] [place], [place] [address]   Basic metabolic panel     Status: Abnormal   Collection Time: 07/08/18  7:46 AM  Result Value Ref Range   Sodium 135 135 - 145 mmol/L   Potassium 3.9 3.5 - 5.1 mmol/L   Chloride 102 98 - 111 mmol/L   CO2 27 22 - 32 mmol/L   Glucose, Bld 137 (H) 70 - 99 mg/dL   BUN 17 8 - 23 mg/dL   Creatinine, Ser 1.17 0.61 - 1.24 mg/dL   Calcium 9.3 8.9 - 10.3 mg/dL   GFR calc non Af Amer >60 >60 mL/min   GFR calc Af Amer >60 >60 mL/min   Anion gap 6 5 - 15    Comment:  Performed at [facility], [address] [place], [place] [address]  CBC     Status: None   Collection Time: 07/08/18  7:46 AM  Result Value Ref Range   WBC 4.8 4.0 - 10.5 K/uL   RBC 4.82 4.22 - 5.81 MIL/uL   Hemoglobin 14.0 13.0 - 17.0 g/dL   HCT 42.5 39.0 - 52.0 %   MCV 88.2 80.0 - 100.0 fL   MCH 29.0 26.0 - 34.0 pg   MCHC 32.9 30.0 - 36.0 g/dL   RDW 13.6 11.5 - 15.5 %   Platelets 201 150 - 400 K/uL   nRBC 0.0 0.0 - 0.2 %    Comment: Performed at [facility], [place],  [place] [address]  Heparin level (unfractionated)     Status: None   Collection Time: 07/08/18 10:57 AM  Result Value Ref Range   Heparin Unfractionated 0.48 0.30 - 0.70 IU/mL    Comment: (NOTE) If heparin results are below expected values, and patient dosage has  been confirmed, suggest follow up testing of antithrombin III levels. Performed at [facility], [address] [place], [place] [address]    Dg Chest 2 View  Result Date: 07/07/2018 CLINICAL DATA:  Chest pain EXAM: CHEST - 2 VIEW COMPARISON:  None. FINDINGS: The heart size and mediastinal contours are within normal limits. Both lungs are clear. The visualized skeletal structures are unremarkable. IMPRESSION: No active cardiopulmonary disease. Electronically Signed   By: Donavan Foil M.D.   On: 07/07/2018 15:28   Ct Angio Chest Aorta W And/or Wo Contrast  Result Date: 07/07/2018 CLINICAL DATA:  68 year old male with history of right-sided arm pain since 1 p.m. today. No associated nausea or vomiting. Pain in the right upper chest. No shortness of breath. EXAM: CT ANGIOGRAPHY CHEST WITH CONTRAST TECHNIQUE: Multidetector CT imaging of the chest was performed using the standard protocol during bolus administration of intravenous contrast. Multiplanar CT image reconstructions and MIPs were obtained to evaluate the vascular anatomy. CONTRAST:  76mL OMNIPAQUE IOHEXOL 350 MG/ML SOLN COMPARISON:  None. FINDINGS:  Cardiovascular: Heart size is normal. There is no significant pericardial fluid, thickening or pericardial calcification. There is aortic atherosclerosis, as well as atherosclerosis of the great vessels of the mediastinum and the coronary arteries, including calcified atherosclerotic plaque in the left main, left anterior descending, left circumflex and right coronary arteries. Precontrast images demonstrate no crescentic high attenuation associated with the wall of the thoracic aorta to suggest acute intramural hemorrhage. No aneurysm or dissection of the thoracic aorta. Mediastinum/Nodes: No pathologically enlarged mediastinal or hilar lymph nodes. Esophagus is unremarkable in appearance. No axillary lymphadenopathy. Lungs/Pleura: No suspicious appearing pulmonary nodules or masses are noted. No acute consolidative airspace disease. No pleural effusions. Upper Abdomen: Low-attenuation lesions in the upper abdomen, incompletely imaged, but correlating to large renal cysts noted on prior CT the abdomen and pelvis 12/21/2014. Diffuse low attenuation throughout the visualized portions of the hepatic parenchyma, indicative of hepatic steatosis. Musculoskeletal: There are no aggressive appearing lytic or blastic lesions noted in the visualized portions of the skeleton. Review of the MIP images confirms the above findings. IMPRESSION: 1. No acute abnormality of the thoracic aorta. 2. Aortic atherosclerosis, in addition to left main and 3 vessel coronary artery disease. Please note that although the presence of coronary artery calcium documents the presence of coronary artery disease, the severity of this disease and any potential stenosis cannot be assessed on this non-gated CT examination. Assessment for potential risk factor modification, dietary therapy or pharmacologic therapy may be warranted, if clinically indicated. 3. Hepatic steatosis. Aortic Atherosclerosis (ICD10-I70.0). Electronically Signed   By: Vinnie Langton M.D.   On: 07/07/2018 18:42    Pending Labs Unresulted Labs (From admission, onward)    Start     Ordered   07/09/18 0500  Heparin level (unfractionated)  Tomorrow morning,   STAT     07/08/18 1233   07/09/18 0500  CBC  Tomorrow morning,   STAT     07/08/18 1233   07/09/18 0981  Basic metabolic panel  Tomorrow morning,   STAT     07/08/18 1625   07/08/18 1044  Hemoglobin A1c  Add-on,   AD  07/08/18 1043   07/08/18 0715  HIV antibody (Routine Testing)  Once,   STAT     07/08/18 0714   07/07/18 1913  Hemoglobin A1c  Add-on,   AD     07/07/18 1912          Vitals/Pain Today's Vitals   07/08/18 1600 07/08/18 1630 07/08/18 1645 07/08/18 1649  BP: 127/90 120/75    Pulse: (!) 111 96  (!) 114  Resp: 18 12    Temp:      TempSrc:      SpO2: 96% 97%    Weight:      Height:      PainSc:   4      Isolation Precautions No active isolations  Medications Medications  morphine 4 MG/ML injection 4 mg (4 mg Intravenous Given 07/08/18 0738)  heparin ADULT infusion 100 units/mL (25000 units/28mL sodium chloride 0.45%) (1,100 Units/hr Intravenous Transfusing/Transfer 07/08/18 1711)  allopurinol (ZYLOPRIM) tablet 300 mg (300 mg Oral Given 07/08/18 1017)  oxyCODONE (Oxy IR/ROXICODONE) immediate release tablet 10 mg (10 mg Oral Given 07/08/18 1646)  amLODipine (NORVASC) tablet 5 mg (5 mg Oral Given 07/08/18 1020)  fenofibrate tablet 160 mg (160 mg Oral Given 07/08/18 1016)  omega-3 acid ethyl esters (LOVAZA) capsule 1 g (1 g Oral Given 07/08/18 1019)  nortriptyline (PAMELOR) capsule 25 mg (25 mg Oral Given 07/08/18 1016)  levothyroxine (SYNTHROID, LEVOTHROID) tablet 125 mcg (125 mcg Oral Given 07/08/18 1017)  docusate sodium (COLACE) capsule 100 mg (has no administration in time range)  pantoprazole (PROTONIX) EC tablet 40 mg (40 mg Oral Given 07/08/18 1020)  finasteride (PROSCAR) tablet 5 mg (5 mg Oral Given 07/08/18 1016)  tamsulosin (FLOMAX) capsule 0.4 mg (0.4 mg Oral Given 07/08/18  1018)  Melatonin TABS 5 mg (has no administration in time range)  gabapentin (NEURONTIN) capsule 300 mg (300 mg Oral Given 07/08/18 1646)  loratadine (CLARITIN) tablet 10 mg (10 mg Oral Given 07/08/18 1022)  fluticasone (FLONASE) 50 MCG/ACT nasal spray 1 spray (1 spray Each Nare Given 07/08/18 1015)  docusate sodium (COLACE) capsule 100 mg (has no administration in time range)  carvedilol (COREG) tablet 3.125 mg (3.125 mg Oral Given 07/08/18 1649)  aspirin EC tablet 81 mg (81 mg Oral Given 07/08/18 1017)  nitroGLYCERIN (NITROSTAT) SL tablet 0.4 mg (has no administration in time range)  atorvastatin (LIPITOR) tablet 40 mg (has no administration in time range)  zolpidem (AMBIEN) tablet 5 mg (has no administration in time range)  sodium chloride 0.9 % bolus 500 mL (0 mLs Intravenous Stopped 07/07/18 1915)  metoprolol tartrate (LOPRESSOR) injection 2.5 mg (2.5 mg Intravenous Given 07/07/18 1806)  iohexol (OMNIPAQUE) 350 MG/ML injection 75 mL (75 mLs Intravenous Contrast Given 07/07/18 1814)  aspirin chewable tablet 324 mg (324 mg Oral Given 07/07/18 1843)  nitroGLYCERIN (NITROGLYN) 2 % ointment 1 inch (1 inch Topical Given 07/07/18 1843)  heparin bolus via infusion 4,000 Units (4,000 Units Intravenous Bolus from Bag 07/07/18 1915)  sodium chloride (OCEAN) 0.65 % nasal spray 1 spray (1 spray Each Nare Given 07/08/18 1025)    Mobility walks Moderate fall risk   Focused Assessments Cardiac Assessment Handoff:  Cardiac Rhythm: Normal sinus rhythm Lab Results  Component Value Date   TROPONINI 1.07 ([employer]) 07/08/2018   No results found for: DDIMER Does the Patient currently have chest pain? No     R Recommendations: See Admitting Provider Note  Report given to:   Additional Notes:  Possible heart cath tomorrow, NPO after midnight

## 2018-07-08 NOTE — ED Notes (Signed)
Pt ate 100% on meal tray, repositioned in bed at this time. Denies further needs. Will continue to monitor for further patient needs.

## 2018-07-08 NOTE — Consult Note (Signed)
ANTICOAGULATION CONSULT NOTE - Initial Consult  Pharmacy Consult for heparin drip management Indication: chest pain/ACS  Patient Measurements: Height: 5\' 6"  (167.6 cm) Weight: 210 lb (95.3 kg) IBW/kg (Calculated) : 63.8 Heparin Dosing Weight: 84 kg  Vital Signs: BP: 122/72 (02/18 0230) Pulse Rate: 65 (02/18 0230)  Labs: Recent Labs    07/07/18 1343 07/07/18 1633 07/07/18 2104 07/08/18 0322  HGB 14.9  --   --   --   HCT 45.5  --   --   --   PLT 219  --   --   --   APTT  --   --  99*  --   LABPROT  --   --  13.2  --   INR  --   --  1.01  --   HEPARINUNFRC  --   --   --  0.50  CREATININE 1.24  --   --   --   TROPONINI 0.03* 0.14* 0.75*  --     Estimated Creatinine Clearance: 60.8 mL/min (by C-G formula based on SCr of 1.24 mg/dL).   Medical History: Past Medical History:  Diagnosis Date  . BPH (benign prostatic hyperplasia)   . Diabetes mellitus without complication ([place])   . Diverticulosis   . Dysuria   . History of kidney stones   . HLD (hyperlipidemia)   . Hx of gout   . Hydronephrosis   . Hypertension   . Hypothyroidism   . Insomnia   . Insomnia   . Marginal ulcers   . Neuromuscular disorder ([place])   . Nocturia   . Over weight   . Prostatitis   . Renal stones   . Testicular hypofunction   . Thyroid disease   . Ulcer   . Urinary frequency    Assessment: 21 YOM presents to the ed with chest pain. Initial troponin elevated at 0.14. He is on no chronic anticoagulation PTA. Baseline labs have been ordered but not yet resulted.  Goal of Therapy:  Heparin level 0.3-0.7 units/ml Monitor platelets by anticoagulation protocol: Yes   Plan:  Give 4000 units bolus x 1 Start heparin infusion at 1100 units/hr Check anti-Xa level in 6 hours and daily while on heparin Continue to monitor H&H and platelets   2/18 0300 heparin level 0.50. Continue current regimen. Recheck in 6 hours to confirm. CBC with heparin level.  Sim Boast, PharmD, BCPS   07/08/18 4:03 AM

## 2018-07-08 NOTE — Progress Notes (Signed)
PHARMACIST - PHYSICIAN ORDER COMMUNICATION  CONCERNING: P&T Medication Policy on Herbal Medications  DESCRIPTION:  This patient's order for:  Flaxseed oil  has been noted.  This product(s) is classified as an "herbal" or natural product. Due to a lack of definitive safety studies or FDA approval, nonstandard manufacturing practices, plus the potential risk of unknown drug-drug interactions while on inpatient medications, the Pharmacy and Therapeutics Committee does not permit the use of "herbal" or natural products of this type within [facility].   ACTION TAKEN: The pharmacy department is unable to verify this order at this time.    Please reevaluate patient's clinical condition at discharge and address if the herbal or natural product(s) should be resumed at that time.

## 2018-07-08 NOTE — Progress Notes (Signed)
*  PRELIMINARY RESULTS* Echocardiogram 2D Echocardiogram has been performed.  [name] 07/08/2018, 2:18 PM

## 2018-07-08 NOTE — ED Notes (Signed)
Echo tech at bedside at this time. Pt in NAD, in bed at this time. Will continue to monitor for further patient needs.

## 2018-07-08 NOTE — ED Notes (Signed)
This RN to bedside, pt c/o pain in his legs, explained oxycodone written q6 hrs. Pt states understanding. Repositioned for patient comfort. Pt remains alert and oriented at this time. Given water at this time. Will continue to monitor for further patient needs.

## 2018-07-08 NOTE — Consult Note (Signed)
Cardiology Consultation:   Patient ID: Troy Rojas MRN: [id number]; DOB: [birth year]  Admit date: 07/07/2018 Date of Consult: 07/08/2018  Primary Care Provider: Maryland Pink, MD Primary Cardiologist: New - Consult by Nakyia Dau Primary Electrophysiologist:  None    Patient Profile:   Troy Rojas is a 68 y.o. male with a hx of hypertension, hyperlipidemia, type 2 diabetes mellitus, BPH, hypothyroidism, diverticulosis, and nephrolithiasis, who is being seen today for the evaluation of right arm pain and elevated troponin at the request of Dr. Anselm Jungling.  History of Present Illness:   Troy Rojas was in his usual state of health until yesterday afternoon, when he developed acute right wrist pain that began radiating proximally to the shoulder.  He describes it as a sensation as if someone was pushing a rod up his arm.  He did not have any chest pain per se nor associated symptoms such as shortness of breath, palpitations, lightheadedness, and nausea.  He denies trauma to the arm.  The pain resolved spontaneously about 1.5 hours after its onset.  Troy Rojas denies a history of prior cardiac disease.  He reports having undergone a stress test greater than 10 years ago, which he believes was normal.  He has not had any chest pain.  He is somewhat limited by chronic leg pain as well as abdominal pain for which he sees a pain clinic in [facility].  In the emergency department, he received nitroglycerin paste, which he believes has not made any difference as he was already asymptomatic by the time it was placed.  He was noted to have a mild troponin elevation of 0.14, which peaked early this morning at 1.28.  CTA of the chest was also performed, which was negative for aortic dissection and pulmonary embolism.  Coronary artery calcification was noted.  Currently, he is asymptomatic.  Past Medical History:  Diagnosis Date  . BPH (benign prostatic hyperplasia)   . Diabetes mellitus without complication  ([place])   . Diverticulosis   . Dysuria   . History of kidney stones   . HLD (hyperlipidemia)   . Hx of gout   . Hydronephrosis   . Hypertension   . Hypothyroidism   . Insomnia   . Insomnia   . Marginal ulcers   . Neuromuscular disorder ([place])   . Nocturia   . Over weight   . Prostatitis   . Renal stones   . Testicular hypofunction   . Thyroid disease   . Ulcer   . Urinary frequency     Past Surgical History:  Procedure Laterality Date  . APPENDECTOMY    . HEMORRHOID SURGERY    . LITHOTRIPSY     x 3  . NASAL SINUS SURGERY    . STOMACH SURGERY    . VASECTOMY       Home Medications:  Prior to Admission medications   Medication Sig Start Date Jaella Weinert Date Taking? Authorizing Provider  allopurinol (ZYLOPRIM) 300 MG tablet Take 1 tablet by mouth daily. 11/14/15  Yes [provider]  amLODipine (NORVASC) 10 MG tablet Take 10 mg by mouth daily.    Yes [provider]  cetirizine (ZYRTEC) 5 MG tablet Take 10 mg by mouth daily as needed.    Yes [provider]  docusate sodium (COLACE) 100 MG capsule Take 100 mg by mouth daily as needed for mild constipation or moderate constipation.    Yes [provider]  fenofibrate micronized (LOFIBRA) 134 MG capsule Take 1 capsule by mouth  at bedtime. 11/14/15  Yes [provider]  finasteride (PROSCAR) 5 MG tablet Take 1 tablet (5 mg total) by mouth daily. 01/25/15  Yes McGowan, Larene Beach A, PA-C  Flaxseed, Linseed, (FLAXSEED OIL) OIL Take 1 capsule by mouth daily.   Yes [provider]  fluticasone (FLONASE) 50 MCG/ACT nasal spray Place 2 sprays into both nostrils daily as needed for allergies or rhinitis.    Yes [provider]  gabapentin (NEURONTIN) 600 MG tablet Take 600 mg by mouth 3 (three) times daily.    Yes [provider]  levothyroxine (SYNTHROID, LEVOTHROID) 125 MCG tablet Take 125 mcg by mouth daily.    Yes [provider]  loratadine-pseudoephedrine  (CLARITIN-D 24 HOUR) 10-240 MG 24 hr tablet Take 1 tablet by mouth daily as needed for allergies.    Yes [provider]  Melatonin 5 MG TABS Take 1 tablet by mouth daily.   Yes [provider]  NONFORMULARY OR COMPOUNDED ITEM Combination Pain Cream-[facility] 2 refills   Yes [provider]  nortriptyline (PAMELOR) 25 MG capsule Take 25 mg by mouth at bedtime.    Yes [provider]  omega-3 acid ethyl esters (LOVAZA) 1 g capsule Take 1 capsule by mouth 2 (two) times daily. 09/02/17  Yes [provider]  Oxycodone HCl 10 MG TABS Take 10 tablets by mouth See admin instructions. Take 1 tablet (10mg ) by mouth five to six times daily as needed for pain   Yes [provider]  pantoprazole (PROTONIX) 40 MG tablet Take 40 mg by mouth daily.   Yes [provider]  senna-docusate (SENOKOT-S) 8.6-50 MG tablet Take 1 tablet by mouth daily as needed for mild constipation.    Yes [provider]  tamsulosin (FLOMAX) 0.4 MG CAPS capsule TAKE 1 CAPSULE BY MOUTH ONCE A DAY Patient taking differently: Take 0.4 mg by mouth daily.  06/20/15  Yes McGowan, Larene Beach A, PA-C  zolpidem (AMBIEN) 10 MG tablet Take 10 mg by mouth at bedtime as needed for sleep.    Yes [provider]    Inpatient Medications: Scheduled Meds: . allopurinol  300 mg Oral Daily  . amLODipine  5 mg Oral Daily  . aspirin EC  81 mg Oral Daily  . carvedilol  3.125 mg Oral BID WC  . fenofibrate  160 mg Oral Daily  . finasteride  5 mg Oral Daily  . fluticasone  1 spray Each Nare Daily  . gabapentin  300 mg Oral TID  . levothyroxine  125 mcg Oral Daily  . loratadine  10 mg Oral Daily  . Melatonin  1 tablet Oral QHS  . nortriptyline  25 mg Oral Daily  . omega-3 acid ethyl esters  1 capsule Oral BID  . pantoprazole  40 mg Oral Daily  . tamsulosin  0.4 mg Oral Daily   Continuous Infusions: . heparin 1,100 Units/hr (07/07/18 1912)   PRN Meds: docusate  sodium, docusate sodium, morphine injection, nitroGLYCERIN, oxyCODONE  Allergies:    Allergies  Allergen Reactions  . Benazepril Other (See Comments)    Other reaction(s): Tongue swelling  . Amitriptyline Other (See Comments)    Reaction unknown  . Duloxetine Hcl Other (See Comments)    Reaction unkown  . Pollen Extract Other (See Comments)    Sinus issues    Social History:   Social History   Tobacco Use  . Smoking status: Never Smoker  . Smokeless tobacco: Never Used  Substance Use Topics  . Alcohol  use: No  . Drug use: No    Family History:   Family History  Problem Relation Age of Onset  . Kidney disease Mother   . Diabetes type II Mother   . Heart disease Mother   . Prostate cancer Neg Hx      ROS:  Please see the history of present illness. All other ROS reviewed and negative.     Physical Exam/Data:   Vitals:   07/08/18 0130 07/08/18 0230 07/08/18 0731 07/08/18 0736  BP: 109/75 122/72 127/87 127/87  Pulse: 84 65  74  Resp: 16 16 15    Temp:      TempSrc:      SpO2: 93% 95%    Weight:      Height:        Intake/Output Summary (Last 24 hours) at 07/08/2018 0827 Last data filed at 07/08/2018 0742 Gross per 24 hour  Intake -  Output 1250 ml  Net -1250 ml   Last 3 Weights 07/07/2018 12/30/2017 09/18/2016  Weight (lbs) 210 lb 212 lb 3.2 oz 212 lb 11.2 oz  Weight (kg) 95.255 kg 96.253 kg 96.48 kg     Body mass index is 33.89 kg/m.  General:  Well nourished, well developed, in no acute distress HEENT: normal Lymph: no adenopathy Neck: no JVD Endocrine:  No thryomegaly Vascular: No carotid bruits; plus radial and pedal pulses bilaterally.  No carotid bruit. Cardiac: Your rate and rhythm without murmurs, rubs, or gallops. Lungs:  clear to auscultation bilaterally, no wheezing, rhonchi or rales  Abd: soft, nontender, no hepatomegaly  Ext: no edema Musculoskeletal:  No deformities, BUE and BLE strength normal and equal Skin: warm and dry  Neuro:  CNs  2-12 intact, no focal abnormalities noted Psych:  Normal affect   EKG:  The EKG was personally reviewed and demonstrates: Sinus tachycardia with right axis deviation and rSR' in V1.  No significant ST segment changes. Telemetry:  Telemetry was personally reviewed and demonstrates: Sinus rhythm.  Relevant CV Studies: None.  Laboratory Data:  Chemistry Recent Labs  Lab 07/07/18 1343 07/08/18 0746  NA 134* 135  K 4.2 3.9  CL 99 102  CO2 25 27  GLUCOSE 167* 137*  BUN 18 17  CREATININE 1.24 1.17  CALCIUM 10.1 9.3  GFRNONAA 59* >60  GFRAA >60 >60  ANIONGAP 10 6    No results for input(s): PROT, ALBUMIN, AST, ALT, ALKPHOS, BILITOT in the last 168 hours. Hematology Recent Labs  Lab 07/07/18 1343 07/08/18 0746  WBC 4.6 4.8  RBC 5.13 4.82  HGB 14.9 14.0  HCT 45.5 42.5  MCV 88.7 88.2  MCH 29.0 29.0  MCHC 32.7 32.9  RDW 13.5 13.6  PLT 219 201   Cardiac Enzymes Recent Labs  Lab 07/07/18 1343 07/07/18 1633 07/07/18 2104 07/08/18 0322 07/08/18 0746  TROPONINI 0.03* 0.14* 0.75* 1.28* 1.07*   No results for input(s): TROPIPOC in the last 168 hours.  BNPNo results for input(s): BNP, PROBNP in the last 168 hours.  DDimer No results for input(s): DDIMER in the last 168 hours.  Radiology/Studies:  Dg Chest 2 View  Result Date: 07/07/2018 CLINICAL DATA:  Chest pain EXAM: CHEST - 2 VIEW COMPARISON:  None. FINDINGS: The heart size and mediastinal contours are within normal limits. Both lungs are clear. The visualized skeletal structures are unremarkable. IMPRESSION: No active cardiopulmonary disease. Electronically Signed   By: Donavan Foil M.D.   On: 07/07/2018 15:28   Ct Angio Chest Aorta W And/or Wo  Contrast  Result Date: 07/07/2018 CLINICAL DATA:  68 year old male with history of right-sided arm pain since 1 p.m. today. No associated nausea or vomiting. Pain in the right upper chest. No shortness of breath. EXAM: CT ANGIOGRAPHY CHEST WITH CONTRAST TECHNIQUE: Multidetector  CT imaging of the chest was performed using the standard protocol during bolus administration of intravenous contrast. Multiplanar CT image reconstructions and MIPs were obtained to evaluate the vascular anatomy. CONTRAST:  43mL OMNIPAQUE IOHEXOL 350 MG/ML SOLN COMPARISON:  None. FINDINGS: Cardiovascular: Heart size is normal. There is no significant pericardial fluid, thickening or pericardial calcification. There is aortic atherosclerosis, as well as atherosclerosis of the great vessels of the mediastinum and the coronary arteries, including calcified atherosclerotic plaque in the left main, left anterior descending, left circumflex and right coronary arteries. Precontrast images demonstrate no crescentic high attenuation associated with the wall of the thoracic aorta to suggest acute intramural hemorrhage. No aneurysm or dissection of the thoracic aorta. Mediastinum/Nodes: No pathologically enlarged mediastinal or hilar lymph nodes. Esophagus is unremarkable in appearance. No axillary lymphadenopathy. Lungs/Pleura: No suspicious appearing pulmonary nodules or masses are noted. No acute consolidative airspace disease. No pleural effusions. Upper Abdomen: Low-attenuation lesions in the upper abdomen, incompletely imaged, but correlating to large renal cysts noted on prior CT the abdomen and pelvis 12/21/2014. Diffuse low attenuation throughout the visualized portions of the hepatic parenchyma, indicative of hepatic steatosis. Musculoskeletal: There are no aggressive appearing lytic or blastic lesions noted in the visualized portions of the skeleton. Review of the MIP images confirms the above findings. IMPRESSION: 1. No acute abnormality of the thoracic aorta. 2. Aortic atherosclerosis, in addition to left main and 3 vessel coronary artery disease. Please note that although the presence of coronary artery calcium documents the presence of coronary artery disease, the severity of this disease and any potential  stenosis cannot be assessed on this non-gated CT examination. Assessment for potential risk factor modification, dietary therapy or pharmacologic therapy may be warranted, if clinically indicated. 3. Hepatic steatosis. Aortic Atherosclerosis (ICD10-I70.0). Electronically Signed   By: Vinnie Langton M.D.   On: 07/07/2018 18:42    Assessment and Plan:  NSTEMI Patient reports very atypical symptoms for myocardial ischemia, though he has multiple cardiac risk factors.  It is possible that right arm discomfort is his anginal equivalent.  Fortunately, at this time, he is pain-free.  Troponin has also peaked.  We discussed proceeding with cardiac catheterization versus pursuing a noninvasive strategy.  Mr. Bunda prefers the latter.  We will obtain a transthoracic echocardiogram and continue treating him with IV heparin.  Based on results of the echocardiogram and recurrence of symptoms, we will determine if myocardial perfusion stress test versus cardiac catheterization will be performed tomorrow.  This will also allow for additional washout of IV contrast that was administered for yesterday's cardiac CTA.  In the meantime, heparin infusion, aspirin, and carvedilol will be continued.  I will initiate atorvastatin 40 mg daily.  Defer adding a P2Y12 inhibitor pending ischemia evaluation, in case CABG were needed.  I will make the patient n.p.o. after midnight for cath versus stress test tomorrow.  Hypertension Blood pressure somewhat labile, elevated this morning.  Continue to monitor.  Continue carvedilol and amlodipine at current doses.  Hyperlipidemia LDL not well controlled.  Patient currently on omega-3 fatty acid and fenofibrate.  I will start atorvastatin 40 mg daily.  Consider discontinuation of fenofibrate.  Diabetes mellitus Consider checking hemoglobin A1c.  Ongoing management per medicine service.  For questions  or updates, please contact [place] Please consult [email or web address] for contact  info under [facility] Cardiology.  Signed, Nelva Bush, MD  07/08/2018 8:27 AM

## 2018-07-08 NOTE — Consult Note (Signed)
ANTICOAGULATION CONSULT NOTE - Initial Consult  Pharmacy Consult for heparin drip management Indication: chest pain/ACS  Patient Measurements: Height: 5\' 6"  (167.6 cm) Weight: 210 lb (95.3 kg) IBW/kg (Calculated) : 63.8 Heparin Dosing Weight: 84 kg  Vital Signs: BP: 139/107 (02/18 0800) Pulse Rate: 80 (02/18 0807)  Labs: Recent Labs    07/07/18 1343  07/07/18 2104 07/08/18 0322 07/08/18 0746  HGB 14.9  --   --   --  14.0  HCT 45.5  --   --   --  42.5  PLT 219  --   --   --  201  APTT  --   --  99*  --   --   LABPROT  --   --  13.2  --   --   INR  --   --  1.01  --   --   HEPARINUNFRC  --   --   --  0.50  --   CREATININE 1.24  --   --   --  1.17  TROPONINI 0.03*   < > 0.75* 1.28* 1.07*   < > = values in this interval not displayed.    Estimated Creatinine Clearance: 64.4 mL/min (by C-G formula based on SCr of 1.17 mg/dL).   Medical History: Past Medical History:  Diagnosis Date  . BPH (benign prostatic hyperplasia)   . Diabetes mellitus without complication ([place])   . Diverticulosis   . Dysuria   . History of kidney stones   . HLD (hyperlipidemia)   . Hx of gout   . Hydronephrosis   . Hypertension   . Hypothyroidism   . Insomnia   . Insomnia   . Marginal ulcers   . Neuromuscular disorder ([place])   . Nocturia   . Over weight   . Prostatitis   . Renal stones   . Testicular hypofunction   . Thyroid disease   . Ulcer   . Urinary frequency    Assessment: 56 YOM presents to the ed with chest pain. Initial troponin elevated at 0.14. He is on no chronic anticoagulation PTA. Baseline labs have been ordered but not yet resulted.  Goal of Therapy:  Heparin level 0.3-0.7 units/ml Monitor platelets by anticoagulation protocol: Yes   Plan:  Give 4000 units bolus x 1 Start heparin infusion at 1100 units/hr Check anti-Xa level in 6 hours and daily while on heparin Continue to monitor H&H and platelets   2/18 0300 heparin level 0.50. Continue current regimen.  Recheck in 6 hours to confirm. CBC with heparin level. 2/18 1057 heparin level 0.48. Continue current regimen with Heparin level/CBC with am labs  [place], [place] 07/08/18 9:41 AM

## 2018-07-08 NOTE — ED Notes (Signed)
Pt transported to floor by Georgina Peer, RN.

## 2018-07-08 NOTE — ED Notes (Signed)
Pt provided with meal tray at this time. Pt sitting up on the side of the bed at this time.

## 2018-07-08 NOTE — ED Notes (Signed)
Pt given meal tray at this time 

## 2018-07-08 NOTE — Progress Notes (Signed)
   07/08/18 1400  Clinical Encounter Type  Visited With Patient  Visit Type Initial  Referral From Nurse  Spiritual Encounters  Spiritual Needs Brochure   OR received to update or complete an AD for the patient. Upon arrival, the patient was resting in bed with the light off and TV on. The patient was pleasant and conversational. He confirmed interest in completing a Living Will, but had left his paperwork at home. No education required as he was familiar with the documents. Patient will wait until his wife arrives later to review and complete. Will ask doctor to call or page when ready to complete.

## 2018-07-08 NOTE — Progress Notes (Signed)
[facility] NAME: Troy Rojas    MR#:  [id number]  DATE OF BIRTH:  Jul 26, 1950  SUBJECTIVE:  CHIEF COMPLAINT:   Chief Complaint  Patient presents with  . Chest Pain   Seen in the ED resting comfortably and watching TV. No chest pain. No recurrence of wrist/arm pain. Explained plan for cardiology evaluation based on their recommendations and he verbalizes understanding. Waiting for inpatient bed.   REVIEW OF SYSTEMS:  CONSTITUTIONAL: No fever, fatigue or weakness.  EYES: No blurred or double vision.  EARS, NOSE, AND THROAT: No tinnitus or ear pain.  RESPIRATORY: No cough, shortness of breath, wheezing or hemoptysis.  CARDIOVASCULAR: He had chest pain, no orthopnea, edema.  GASTROINTESTINAL: No nausea, vomiting, diarrhea or acute abdominal pain. Chronic abdominal pain. Follows at pain clinic.  GENITOURINARY: No dysuria, hematuria.  ENDOCRINE: No polyuria, nocturia,  HEMATOLOGY: No anemia, easy bruising or bleeding SKIN: No rash or lesion. MUSCULOSKELETAL: Chronic back pain.    NEUROLOGIC: No tingling, numbness, weakness.  PSYCHIATRY: No anxiety or depression.  DRUG ALLERGIES:   Allergies  Allergen Reactions  . Benazepril Other (See Comments)    Other reaction(s): Tongue swelling  . Amitriptyline Other (See Comments)    Reaction unknown  . Duloxetine Hcl Other (See Comments)    Reaction unkown  . Pollen Extract Other (See Comments)    Sinus issues   VITALS:  Blood pressure 140/87, pulse (!) 108, temperature (!) 97.5 F (36.4 C), temperature source Oral, resp. rate (!) 21, height 5\' 6"  (1.676 m), weight 95.3 kg, SpO2 92 %. PHYSICAL EXAMINATION:  GENERAL:  68 y.o.-year-old patient lying in the bed with no acute distress.  EYES: Pupils equal, round, reactive to light and accommodation. No scleral icterus. Extraocular muscles intact.  HEENT: Head atraumatic, normocephalic. Oropharynx and nasopharynx clear.  NECK:  Supple,  no jugular venous distention. No thyroid enlargement, no tenderness.  LUNGS: Normal breath sounds bilaterally, no wheezing, rales,rhonchi or crepitation. No use of accessory muscles of respiration.  CARDIOVASCULAR: S1, S2 normal. No murmurs, rubs, or gallops.  ABDOMEN: Soft, nontender, nondistended. Bowel sounds present. No organomegaly or mass.  EXTREMITIES: No pedal edema, cyanosis, or clubbing.  NEUROLOGIC: Cranial nerves II through XII are intact. Muscle strength 5/5 in all extremities. Sensation intact. Gait not checked.  PSYCHIATRIC: The patient is alert and oriented x 3.  SKIN: No obvious rash, lesion, or ulcer.  LABORATORY PANEL:  Male CBC Recent Labs  Lab 07/08/18 0746  WBC 4.8  HGB 14.0  HCT 42.5  PLT 201   ------------------------------------------------------------------------------------------------------------------ Chemistries  Recent Labs  Lab 07/08/18 0746  NA 135  K 3.9  CL 102  CO2 27  GLUCOSE 137*  BUN 17  CREATININE 1.17  CALCIUM 9.3   RADIOLOGY:  Ct Angio Chest Aorta W And/or Wo Contrast  Result Date: 07/07/2018 CLINICAL DATA:  68 year old male with history of right-sided arm pain since 1 p.m. today. No associated nausea or vomiting. Pain in the right upper chest. No shortness of breath. EXAM: CT ANGIOGRAPHY CHEST WITH CONTRAST TECHNIQUE: Multidetector CT imaging of the chest was performed using the standard protocol during bolus administration of intravenous contrast. Multiplanar CT image reconstructions and MIPs were obtained to evaluate the vascular anatomy. CONTRAST:  74mL OMNIPAQUE IOHEXOL 350 MG/ML SOLN COMPARISON:  None. FINDINGS: Cardiovascular: Heart size is normal. There is no significant pericardial fluid, thickening or pericardial calcification. There is aortic atherosclerosis, as well as atherosclerosis of the great vessels  of the mediastinum and the coronary arteries, including calcified atherosclerotic plaque in the left main, left anterior  descending, left circumflex and right coronary arteries. Precontrast images demonstrate no crescentic high attenuation associated with the wall of the thoracic aorta to suggest acute intramural hemorrhage. No aneurysm or dissection of the thoracic aorta. Mediastinum/Nodes: No pathologically enlarged mediastinal or hilar lymph nodes. Esophagus is unremarkable in appearance. No axillary lymphadenopathy. Lungs/Pleura: No suspicious appearing pulmonary nodules or masses are noted. No acute consolidative airspace disease. No pleural effusions. Upper Abdomen: Low-attenuation lesions in the upper abdomen, incompletely imaged, but correlating to large renal cysts noted on prior CT the abdomen and pelvis 12/21/2014. Diffuse low attenuation throughout the visualized portions of the hepatic parenchyma, indicative of hepatic steatosis. Musculoskeletal: There are no aggressive appearing lytic or blastic lesions noted in the visualized portions of the skeleton. Review of the MIP images confirms the above findings. IMPRESSION: 1. No acute abnormality of the thoracic aorta. 2. Aortic atherosclerosis, in addition to left main and 3 vessel coronary artery disease. Please note that although the presence of coronary artery calcium documents the presence of coronary artery disease, the severity of this disease and any potential stenosis cannot be assessed on this non-gated CT examination. Assessment for potential risk factor modification, dietary therapy or pharmacologic therapy may be warranted, if clinically indicated. 3. Hepatic steatosis. Aortic Atherosclerosis (ICD10-I70.0). Electronically Signed   By: Vinnie Langton M.D.   On: 07/07/2018 18:42   ASSESSMENT AND PLAN:   Troy Rojas is a 68 y.o. male with a history of hypertension, hyperlipidemia, type 2 diabetes mellitus, BPH, hypothyroidism, diverticulosis, and nephrolithiasis, who was admitted for evaluation of right arm pain and found to have elevated troponin, admitted  for NSTEMI management.  *Non-ST elevation MI Heparin IV drip, pharmacy following for dosing Monitor on telemetry and followed serial troponin which peaked this AM TTE Echocardiogram completed: pending read lipid panel: LDL 162 chol 234 hemoglobin A1c: in process Seen by cardiology/Dr. End, recommendation for myocardial perfusion stress test vs cardiac cath tomorrow pending Echo read, recommendations copied below: heparin infusion, aspirin, and carvedilol will be continued. atorvastatin 40 mg daily.  Defer adding a P2Y12 inhibitor pending ischemia evaluation, in case CABG were needed.  n.p.o. after midnight for cath versus stress test tomorrow.  *Hypertension Continue home medications, amlodipine. Carvedilol added because of possible acute MI, continue. Monitor.  *Hyperlipidemia LDL not well controlled. Patient currently on omega-3 fatty acid and fenofibrate. Cardiology started atorvastatin 40 mg as above  *Neuropathy Continue home meds.  *Hypothyroidism Continue levothyroxine.  *BPH Continue Flomax and finasteride.  *Chronic pain Continue pain management. Patient follows with a pain clinic in [facility].   All the records are reviewed and case is discussed with Care Management/Social Worker. Management plans discussed with the patient and/or family and they are in agreement.  CODE STATUS: Full Code  TOTAL TIME TAKING CARE OF THIS PATIENT: 30 minutes.   More than 50% of the time was spent in counseling/coordination of care: YES  POSSIBLE D/C IN 1 DAYS, DEPENDING ON CLINICAL CONDITION.  Roselle Norton PA-C on 07/08/2018 at 3:50 PM  Between 7am to 6pm - Pager - [phone]  After 6 pm go to [email or web address] - Proofreader  [facility]  Office  [phone]  CC: Primary care physician; [place] Pink, MD  Note: This dictation was prepared with Dragon dictation along with smaller phrase technology. Any transcriptional errors that result  from this process are unintentional.

## 2018-07-08 NOTE — Consult Note (Signed)
ANTICOAGULATION CONSULT NOTE   Pharmacy Consult for heparin drip management Indication: chest pain/ACS  Patient Measurements: Height: 5\' 6"  (167.6 cm) Weight: 210 lb (95.3 kg) IBW/kg (Calculated) : 63.8 Heparin Dosing Weight: 84 kg  Vital Signs: BP: 146/93 (02/18 1130) Pulse Rate: 69 (02/18 1130)  Labs: Recent Labs    07/07/18 1343  07/07/18 2104 07/08/18 0322 07/08/18 0746 07/08/18 1057  HGB 14.9  --   --   --  14.0  --   HCT 45.5  --   --   --  42.5  --   PLT 219  --   --   --  201  --   APTT  --   --  99*  --   --   --   LABPROT  --   --  13.2  --   --   --   INR  --   --  1.01  --   --   --   HEPARINUNFRC  --   --   --  0.50  --  0.48  CREATININE 1.24  --   --   --  1.17  --   TROPONINI 0.03*   < > 0.75* 1.28* 1.07*  --    < > = values in this interval not displayed.    Estimated Creatinine Clearance: 64.4 mL/min (by C-G formula based on SCr of 1.17 mg/dL).   Medical History: Past Medical History:  Diagnosis Date  . BPH (benign prostatic hyperplasia)   . Diabetes mellitus without complication ([place])   . Diverticulosis   . Dysuria   . History of kidney stones   . HLD (hyperlipidemia)   . Hx of gout   . Hydronephrosis   . Hypertension   . Hypothyroidism   . Insomnia   . Insomnia   . Marginal ulcers   . Neuromuscular disorder ([place])   . Nocturia   . Over weight   . Prostatitis   . Renal stones   . Testicular hypofunction   . Thyroid disease   . Ulcer   . Urinary frequency    Assessment: 14 YOM presents to the ed with chest pain. Initial troponin elevated at 0.14. He is on no chronic anticoagulation PTA. Baseline labs have been ordered but not yet resulted.  Goal of Therapy:  Heparin level 0.3-0.7 units/ml Monitor platelets by anticoagulation protocol: Yes   Plan:  Give 4000 units bolus x 1 Start heparin infusion at 1100 units/hr Check anti-Xa level in 6 hours and daily while on heparin Continue to monitor H&H and platelets   2/18 0300  heparin level 0.50. Continue current regimen. Recheck in 6 hours to confirm. CBC with heparin level.   2/18 ~ 11:00 HL = 0.48.  Continue current regimen. Recheck HL/CBC with am labs.   Olivia Canter, [facility] 07/08/18 12:31 PM

## 2018-07-09 ENCOUNTER — Ambulatory Visit: Payer: Medicare Other | Admitting: Podiatry

## 2018-07-09 ENCOUNTER — Encounter
Admission: EM | Disposition: A | Discharge status: Home / Self Care | Payer: Self-pay | Source: Home / Self Care | Attending: Internal Medicine

## 2018-07-09 ENCOUNTER — Encounter: Payer: Self-pay | Admitting: Internal Medicine

## 2018-07-09 DIAGNOSIS — I2510 Atherosclerotic heart disease of native coronary artery without angina pectoris: Secondary | ICD-10-CM

## 2018-07-09 HISTORY — PX: LEFT HEART CATH AND CORONARY ANGIOGRAPHY: CATH118249

## 2018-07-09 LAB — CBC
HCT: 44.8 % (ref 39.0–52.0)
Hemoglobin: 14.5 g/dL (ref 13.0–17.0)
MCH: 28.8 pg (ref 26.0–34.0)
MCHC: 32.4 g/dL (ref 30.0–36.0)
MCV: 88.9 fL (ref 80.0–100.0)
Platelets: 205 10*3/uL (ref 150–400)
RBC: 5.04 MIL/uL (ref 4.22–5.81)
RDW: 13.9 % (ref 11.5–15.5)
WBC: 5.2 10*3/uL (ref 4.0–10.5)
nRBC: 0 % (ref 0.0–0.2)

## 2018-07-09 LAB — BASIC METABOLIC PANEL
Anion gap: 10 (ref 5–15)
BUN: 22 mg/dL (ref 8–23)
CALCIUM: 9.8 mg/dL (ref 8.9–10.3)
CO2: 27 mmol/L (ref 22–32)
Chloride: 102 mmol/L (ref 98–111)
Creatinine, Ser: 1.38 mg/dL — ABNORMAL HIGH (ref 0.61–1.24)
GFR calc Af Amer: >60 mL/min (ref >60)
GFR, EST NON AFRICAN AMERICAN: 52 mL/min — AB (ref >60)
GLUCOSE: 159 mg/dL — AB (ref 70–99)
Potassium: 4.2 mmol/L (ref 3.5–5.1)
Sodium: 139 mmol/L (ref 135–145)

## 2018-07-09 LAB — HIV ANTIBODY (ROUTINE TESTING W REFLEX): HIV Screen 4th Generation wRfx: NONREACTIVE

## 2018-07-09 LAB — GLUCOSE, CAPILLARY
Glucose-Capillary: 122 mg/dL — ABNORMAL HIGH (ref 70–99)
Glucose-Capillary: 232 mg/dL — ABNORMAL HIGH (ref 70–99)

## 2018-07-09 LAB — HEMOGLOBIN A1C
Hgb A1c MFr Bld: 9.3 % — ABNORMAL HIGH (ref 4.8–5.6)
Hgb A1c MFr Bld: 9.4 % — ABNORMAL HIGH (ref 4.8–5.6)
Mean Plasma Glucose: 220 mg/dL
Mean Plasma Glucose: 223 mg/dL

## 2018-07-09 LAB — HEPARIN LEVEL (UNFRACTIONATED): Heparin Unfractionated: 0.33 IU/mL (ref 0.30–0.70)

## 2018-07-09 SURGERY — LEFT HEART CATH AND CORONARY ANGIOGRAPHY
Anesthesia: Moderate Sedation

## 2018-07-09 MED ORDER — CLOPIDOGREL BISULFATE 75 MG PO TABS
300.0000 mg | ORAL_TABLET | Freq: Once | ORAL | Status: AC
  Administered 2018-07-09: 300 mg via ORAL
  Filled 2018-07-09: qty 4

## 2018-07-09 MED ORDER — SODIUM CHLORIDE 0.9 % IV SOLN
INTRAVENOUS | Status: DC
  Administered 2018-07-09: 14:00:00 via INTRAVENOUS

## 2018-07-09 MED ORDER — SODIUM CHLORIDE 0.9 % IV SOLN: 250.0000 mL | INTRAVENOUS | Status: DC | PRN

## 2018-07-09 MED ORDER — FENTANYL CITRATE (PF) 100 MCG/2ML IJ SOLN
INTRAMUSCULAR | Status: DC | PRN
  Administered 2018-07-09: 50 ug via INTRAVENOUS

## 2018-07-09 MED ORDER — ASPIRIN 81 MG PO CHEW
81.0000 mg | CHEWABLE_TABLET | ORAL | Status: AC
  Administered 2018-07-09: 81 mg via ORAL
  Filled 2018-07-09: qty 1

## 2018-07-09 MED ORDER — ASPIRIN 81 MG PO TBEC: 81.0000 mg | DELAYED_RELEASE_TABLET | Freq: Every day | ORAL | 0 refills | Status: DC

## 2018-07-09 MED ORDER — SODIUM CHLORIDE 0.9% FLUSH: 3.0000 mL | INTRAVENOUS | Status: DC | PRN

## 2018-07-09 MED ORDER — SODIUM CHLORIDE 0.9% FLUSH: 3.0000 mL | Freq: Two times a day (BID) | INTRAVENOUS | Status: DC

## 2018-07-09 MED ORDER — VERAPAMIL HCL 2.5 MG/ML IV SOLN
INTRAVENOUS | Status: DC | PRN
  Administered 2018-07-09 (×2): 2.5 mg via INTRA_ARTERIAL

## 2018-07-09 MED ORDER — NITROGLYCERIN 0.4 MG SL SUBL: 0.4000 mg | SUBLINGUAL_TABLET | SUBLINGUAL | 0 refills | Status: AC | PRN

## 2018-07-09 MED ORDER — HEPARIN (PORCINE) IN NACL 1000-0.9 UT/500ML-% IV SOLN
INTRAVENOUS | Status: AC
  Filled 2018-07-09: qty 1000

## 2018-07-09 MED ORDER — MIDAZOLAM HCL 2 MG/2ML IJ SOLN
INTRAMUSCULAR | Status: DC | PRN
  Administered 2018-07-09: 1 mg via INTRAVENOUS

## 2018-07-09 MED ORDER — INSULIN ASPART 100 UNIT/ML ~~LOC~~ SOLN: 0.0000 [IU] | Freq: Every day | SUBCUTANEOUS | Status: DC

## 2018-07-09 MED ORDER — HEPARIN SODIUM (PORCINE) 1000 UNIT/ML IJ SOLN
INTRAMUSCULAR | Status: DC | PRN
  Administered 2018-07-09: 4500 [IU] via INTRAVENOUS

## 2018-07-09 MED ORDER — CLOPIDOGREL BISULFATE 75 MG PO TABS: 75.0000 mg | ORAL_TABLET | Freq: Every day | ORAL | 0 refills | Status: DC

## 2018-07-09 MED ORDER — VERAPAMIL HCL 2.5 MG/ML IV SOLN
INTRAVENOUS | Status: AC
  Filled 2018-07-09: qty 2

## 2018-07-09 MED ORDER — CARVEDILOL 3.125 MG PO TABS: 3.1250 mg | ORAL_TABLET | Freq: Two times a day (BID) | ORAL | 0 refills | Status: DC

## 2018-07-09 MED ORDER — HEPARIN SODIUM (PORCINE) 5000 UNIT/ML IJ SOLN: 5000.0000 [IU] | Freq: Three times a day (TID) | INTRAMUSCULAR | Status: DC

## 2018-07-09 MED ORDER — INSULIN ASPART 100 UNIT/ML ~~LOC~~ SOLN
0.0000 [IU] | Freq: Three times a day (TID) | SUBCUTANEOUS | Status: DC
  Administered 2018-07-09: 1 [IU] via SUBCUTANEOUS
  Administered 2018-07-09: 3 [IU] via SUBCUTANEOUS
  Filled 2018-07-09 (×2): qty 1

## 2018-07-09 MED ORDER — HEPARIN SODIUM (PORCINE) 1000 UNIT/ML IJ SOLN
INTRAMUSCULAR | Status: AC
  Filled 2018-07-09: qty 1

## 2018-07-09 MED ORDER — FENTANYL CITRATE (PF) 100 MCG/2ML IJ SOLN
INTRAMUSCULAR | Status: AC
  Filled 2018-07-09: qty 2

## 2018-07-09 MED ORDER — ATORVASTATIN CALCIUM 40 MG PO TABS: 40.0000 mg | ORAL_TABLET | Freq: Every day | ORAL | 0 refills | Status: DC

## 2018-07-09 MED ORDER — ASPIRIN 81 MG PO CHEW: 81.0000 mg | CHEWABLE_TABLET | ORAL | Status: DC

## 2018-07-09 MED ORDER — CLOPIDOGREL BISULFATE 75 MG PO TABS: 75.0000 mg | ORAL_TABLET | Freq: Every day | ORAL | Status: DC

## 2018-07-09 MED ORDER — MIDAZOLAM HCL 2 MG/2ML IJ SOLN
INTRAMUSCULAR | Status: AC
  Filled 2018-07-09: qty 2

## 2018-07-09 MED ORDER — LIVING WELL WITH DIABETES BOOK
Freq: Once | Status: DC
  Filled 2018-07-09: qty 1

## 2018-07-09 MED ORDER — SODIUM CHLORIDE 0.9 % IV SOLN
INTRAVENOUS | Status: DC
  Administered 2018-07-09: 07:00:00 via INTRAVENOUS

## 2018-07-09 MED ORDER — HEPARIN (PORCINE) IN NACL 1000-0.9 UT/500ML-% IV SOLN
INTRAVENOUS | Status: DC | PRN
  Administered 2018-07-09: 1000 mL

## 2018-07-09 MED ORDER — IOPAMIDOL (ISOVUE-300) INJECTION 61%
INTRAVENOUS | Status: DC | PRN
  Administered 2018-07-09: 40 mL via INTRA_ARTERIAL

## 2018-07-09 SURGICAL SUPPLY — 8 items
CATH INFINITI 5FR JK (CATHETERS) ×2 IMPLANT
DEVICE RAD TR BAND REGULAR (VASCULAR PRODUCTS) ×3 IMPLANT
GLIDESHEATH SLEND A-KIT 6F 22G (SHEATH) ×2 IMPLANT
GLIDESHEATH SLEND SS 6F .021 (SHEATH) ×2 IMPLANT
KIT MANI 3VAL PERCEP (MISCELLANEOUS) ×3 IMPLANT
PACK CARDIAC CATH (CUSTOM PROCEDURE TRAY) ×3 IMPLANT
WIRE HITORQ VERSACORE ST 145CM (WIRE) ×3 IMPLANT
WIRE ROSEN-J .035X260CM (WIRE) ×2 IMPLANT

## 2018-07-09 NOTE — Progress Notes (Signed)
Patient discharged to home with his wife.  Prescriptions e-scribed to total care. Follow up appointments made.  Instructions on care of his wrist - cath site.

## 2018-07-09 NOTE — Interval H&P Note (Signed)
History and Physical Interval Note:  07/09/2018 12:26 PM  Troy Rojas  has presented today for cardiac catheterization, with the diagnosis of NSTEMI  The various methods of treatment have been discussed with the patient and family. After consideration of risks, benefits and other options for treatment, the patient has consented to  Procedure(s): LEFT HEART CATH AND CORONARY ANGIOGRAPHY (N/A) as a surgical intervention .  The patient's history has been reviewed, patient examined, no change in status, stable for surgery.  I have reviewed the patient's chart and labs.  Questions were answered to the patient's satisfaction.    Cath Lab Visit (complete for each Cath Lab visit)  Clinical Evaluation Leading to the Procedure:   ACS: Yes.    Non-ACS:  N/A  Shara Hartis

## 2018-07-09 NOTE — Progress Notes (Addendum)
Inpatient Diabetes Program Recommendations  AACE/ADA: New Consensus Statement on Inpatient Glycemic Control   Target Ranges:  Prepandial:   less than 140 mg/dL      Peak postprandial:   less than 180 mg/dL (1-2 hours)      Critically ill patients:  140 - 180 mg/dL  Results for Troy Rojas, Troy Rojas (MRN [id number]) as of 07/09/2018 08:38  Ref. Range 07/08/2018 07:46 07/09/2018 04:59  Glucose Latest Ref Range: 70 - 99 mg/dL 137 (H) 159 (H)   Results for Troy Rojas, Troy Rojas (MRN [id number]) as of 07/09/2018 08:38  Ref. Range 07/07/2018 21:04 07/08/2018 10:57  Hemoglobin A1C Latest Ref Range: 4.8 - 5.6 % 9.3 (H) 9.4 (H)   Review of Glycemic Control  Diabetes history: DM2 Outpatient Diabetes medications: None listed on home medication list; per PCP office notes in Care Everywhere patient is prescribed Metformin XR 1000 mg daily Current orders for Inpatient glycemic control: Novolog 0-9 units TID with meals, Novolog 0-5 units QHS  Inpatient Diabetes Program Recommendations:  HgbA1C: A1C 9.4% on 07/08/18 indicating an average glucose of 223 mg/dl over the past 2-3 months.  Per Care Everywhere, A1C was 8.5% on 04/02/2018 by PCP.  Per PCP note, patient is prescribed Metformin XR 1000 mg daily. Patient will need additional oral DM medications as an outpatient to improve DM control.  NOTE: Noted consult for Diabetes Coordinator. Chart reviewed. Will plan to talk with patient today.   Addendum 07/09/18@13 :45:Went by to see patient but he is currently off the unit in Cath Lab. Will plan to try to see patient again tomorrow.  Thanks, Barnie Alderman, RN, MSN, CDE Diabetes Coordinator Inpatient Diabetes Program [phone] (Team Pager from 8am to 5pm)

## 2018-07-09 NOTE — Consult Note (Signed)
ANTICOAGULATION CONSULT NOTE   Pharmacy Consult for heparin drip management Indication: chest pain/ACS  Patient Measurements: Height: 5\' 6"  (167.6 cm) Weight: 202 lb (91.6 kg) IBW/kg (Calculated) : 63.8 Heparin Dosing Weight: 84 kg  Vital Signs: Temp: 98.3 F (36.8 C) (02/19 0552) Temp Source: Oral (02/19 0552) BP: 127/84 (02/19 0552) Pulse Rate: 75 (02/19 0552)  Labs: Recent Labs    07/07/18 1343  07/07/18 2104 07/08/18 0322 07/08/18 0746 07/08/18 1057 07/09/18 0459  HGB 14.9  --   --   --  14.0  --  14.5  HCT 45.5  --   --   --  Troy.5  --  44.8  PLT 219  --   --   --  201  --  205  APTT  --   --  99*  --   --   --   --   LABPROT  --   --  13.2  --   --   --   --   INR  --   --  1.01  --   --   --   --   HEPARINUNFRC  --   --   --  0.50  --  0.48 0.33  CREATININE 1.24  --   --   --  1.17  --  1.38*  TROPONINI 0.03*   < > 0.75* 1.28* 1.07*  --   --    < > = values in this interval not displayed.    Estimated Creatinine Clearance: 53.5 mL/min (A) (by C-G formula based on SCr of 1.38 mg/dL (H)).   Medical History: Past Medical History:  Diagnosis Date  . BPH (benign prostatic hyperplasia)   . Diabetes mellitus without complication ([place])   . Diverticulosis   . Dysuria   . History of kidney stones   . HLD (hyperlipidemia)   . Hx of gout   . Hydronephrosis   . Hypertension   . Hypothyroidism   . Insomnia   . Insomnia   . Marginal ulcers   . Neuromuscular disorder ([place])   . Nocturia   . Over weight   . Prostatitis   . Renal stones   . Testicular hypofunction   . Thyroid disease   . Ulcer   . Urinary frequency    Assessment: Troy Rojas presents to the ed with chest pain. Initial troponin elevated at 0.14. He is on no chronic anticoagulation PTA. Baseline labs have been ordered but not yet resulted.  Goal of Therapy:  Heparin level 0.3-0.7 units/ml Monitor platelets by anticoagulation protocol: Yes   Plan:  Give 4000 units bolus x 1 Start heparin  infusion at 1100 units/hr Check anti-Xa level in 6 hours and daily while on heparin Continue to monitor H&H and platelets   2/18 0300 heparin level 0.50. Continue current regimen. Recheck in 6 hours to confirm. CBC with heparin level.   2/18 ~ 11:00 HL = 0.48.  Continue current regimen. Recheck HL/CBC with am labs.   0219 AM heparin level 0.33. Continue current regimen. Recheck heparin level and CBC with tomorrow AM labs.  Sim Boast, PharmD, BCPS  07/09/18 6:26 AM

## 2018-07-09 NOTE — Discharge Instructions (Signed)
Take these medications then follow-up with your PCP within 5 days and your cardiologist within 1-2 weeks (we have made a cardiology appointment for you, and Dr. Barbarann Ehlers office will call you to make an appointment).  1. Take aspirin 81 mg daily 2. Take clopidogrel (Plavix) 75 mg daily with breakfast 3. Take atorvastatin (lipitor) 40 mg at bedtime  4. Take carvedilol (coreg) 3.125 mg twice daily with meals (breakfast and dinner) 5. If you have chest pain, take nitroglycerin (Nitrostat) sublingual (below the tongue) as needed, for chest pain only. This is not a daily medication, only as needed.  You mentioned your past history of GI bleeding many years ago. Given your admission and recent cardiac catheterization these medications are all still recommended by the cardiologist. When you follow-up you can discuss any changes to long-term management.  Continue taking your other medications and follow-up with Dr. Kary Kos for continued diabetes management.  Your last A1c was 9.4%, which means your blood sugars could be controlled a little better.   Follow up with the pain clinic at the end of April as scheduled already.

## 2018-07-09 NOTE — Progress Notes (Signed)
Progress Note  Patient Name: Troy Rojas Date of Encounter: 07/09/2018  Primary Cardiologist: Formerly Ingal  Subjective   Feels well.  No chest pain, arm pain, or shortness of breath.  Only complaint is chronic left leg pain.  Inpatient Medications    Scheduled Meds: . allopurinol  300 mg Oral Daily  . amLODipine  5 mg Oral Daily  . aspirin EC  81 mg Oral Daily  . atorvastatin  40 mg Oral q1800  . carvedilol  3.125 mg Oral BID WC  . fenofibrate  160 mg Oral Daily  . finasteride  5 mg Oral Daily  . fluticasone  1 spray Each Nare Daily  . gabapentin  300 mg Oral TID  . levothyroxine  125 mcg Oral Daily  . loratadine  10 mg Oral Daily  . Melatonin  1 tablet Oral QHS  . nortriptyline  25 mg Oral Daily  . omega-3 acid ethyl esters  1 capsule Oral BID  . pantoprazole  40 mg Oral Daily  . tamsulosin  0.4 mg Oral Daily   Continuous Infusions: . sodium chloride 150 mL/hr at 07/09/18 0704  . heparin 1,100 Units/hr (07/09/18 0300)   PRN Meds: docusate sodium, morphine injection, nitroGLYCERIN, oxyCODONE, zolpidem   Vital Signs    Vitals:   07/08/18 1715 07/08/18 1745 07/08/18 1922 07/09/18 0552  BP: (!) 141/87 (!) 125/94 121/87 127/84  Pulse: (!) 114 (!) 104 94 75  Resp: (!) 22 18 16 16   Temp:   98.1 F (36.7 C) 98.3 F (36.8 C)  TempSrc:   Oral Oral  SpO2: 95% 95% 96% 95%  Weight:  92.3 kg  91.6 kg  Height:  5\' 6"  (1.676 m)      Intake/Output Summary (Last 24 hours) at 07/09/2018 0725 Last data filed at 07/09/2018 0600 Gross per 24 hour  Intake 746.9 ml  Output 1450 ml  Net -703.1 ml   Last 3 Weights 07/09/2018 07/08/2018 07/07/2018  Weight (lbs) 202 lb 203 lb 6.4 oz 210 lb  Weight (kg) 91.627 kg 92.262 kg 95.255 kg      Telemetry    NSR - Personally Reviewed  ECG    No new tracing.  Physical Exam   GEN: No acute distress.   Neck: No JVD Cardiac: RRR, no murmurs, rubs, or gallops.  Respiratory: Clear to auscultation bilaterally. GI: Soft,  nontender, non-distended  MS: No edema; No deformity. Neuro:  Nonfocal  Psych: Normal affect   Labs    Chemistry Recent Labs  Lab 07/07/18 1343 07/08/18 0746 07/09/18 0459  NA 134* 135 139  K 4.2 3.9 4.2  CL 99 102 102  CO2 25 27 27   GLUCOSE 167* 137* 159*  BUN 18 17 22   CREATININE 1.24 1.17 1.38*  CALCIUM 10.1 9.3 9.8  GFRNONAA 59* >60 52*  GFRAA >60 >60 >60  ANIONGAP 10 6 10      Hematology Recent Labs  Lab 07/07/18 1343 07/08/18 0746 07/09/18 0459  WBC 4.6 4.8 5.2  RBC 5.13 4.82 5.04  HGB 14.9 14.0 14.5  HCT 45.5 42.5 44.8  MCV 88.7 88.2 88.9  MCH 29.0 29.0 28.8  MCHC 32.7 32.9 32.4  RDW 13.5 13.6 13.9  PLT 219 201 205    Cardiac Enzymes Recent Labs  Lab 07/07/18 1633 07/07/18 2104 07/08/18 0322 07/08/18 0746  TROPONINI 0.14* 0.75* 1.28* 1.07*   No results for input(s): TROPIPOC in the last 168 hours.   BNPNo results for input(s): BNP, PROBNP in the last  168 hours.   DDimer No results for input(s): DDIMER in the last 168 hours.   Radiology    Dg Chest 2 View  Result Date: 07/07/2018 CLINICAL DATA:  Chest pain EXAM: CHEST - 2 VIEW COMPARISON:  None. FINDINGS: The heart size and mediastinal contours are within normal limits. Both lungs are clear. The visualized skeletal structures are unremarkable. IMPRESSION: No active cardiopulmonary disease. Electronically Signed   By: Donavan Foil M.D.   On: 07/07/2018 15:28   Ct Angio Chest Aorta W And/or Wo Contrast  Result Date: 07/07/2018 CLINICAL DATA:  68 year old male with history of right-sided arm pain since 1 p.m. today. No associated nausea or vomiting. Pain in the right upper chest. No shortness of breath. EXAM: CT ANGIOGRAPHY CHEST WITH CONTRAST TECHNIQUE: Multidetector CT imaging of the chest was performed using the standard protocol during bolus administration of intravenous contrast. Multiplanar CT image reconstructions and MIPs were obtained to evaluate the vascular anatomy. CONTRAST:  68mL  OMNIPAQUE IOHEXOL 350 MG/ML SOLN COMPARISON:  None. FINDINGS: Cardiovascular: Heart size is normal. There is no significant pericardial fluid, thickening or pericardial calcification. There is aortic atherosclerosis, as well as atherosclerosis of the great vessels of the mediastinum and the coronary arteries, including calcified atherosclerotic plaque in the left main, left anterior descending, left circumflex and right coronary arteries. Precontrast images demonstrate no crescentic high attenuation associated with the wall of the thoracic aorta to suggest acute intramural hemorrhage. No aneurysm or dissection of the thoracic aorta. Mediastinum/Nodes: No pathologically enlarged mediastinal or hilar lymph nodes. Esophagus is unremarkable in appearance. No axillary lymphadenopathy. Lungs/Pleura: No suspicious appearing pulmonary nodules or masses are noted. No acute consolidative airspace disease. No pleural effusions. Upper Abdomen: Low-attenuation lesions in the upper abdomen, incompletely imaged, but correlating to large renal cysts noted on prior CT the abdomen and pelvis 12/21/2014. Diffuse low attenuation throughout the visualized portions of the hepatic parenchyma, indicative of hepatic steatosis. Musculoskeletal: There are no aggressive appearing lytic or blastic lesions noted in the visualized portions of the skeleton. Review of the MIP images confirms the above findings. IMPRESSION: 1. No acute abnormality of the thoracic aorta. 2. Aortic atherosclerosis, in addition to left main and 3 vessel coronary artery disease. Please note that although the presence of coronary artery calcium documents the presence of coronary artery disease, the severity of this disease and any potential stenosis cannot be assessed on this non-gated CT examination. Assessment for potential risk factor modification, dietary therapy or pharmacologic therapy may be warranted, if clinically indicated. 3. Hepatic steatosis. Aortic  Atherosclerosis (ICD10-I70.0). Electronically Signed   By: Vinnie Langton M.D.   On: 07/07/2018 18:42    Cardiac Studies   TTE (07/08/2018):  1. The left ventricle has normal systolic function, with an ejection fraction of 55-60%. The cavity size was normal. There is mildly increased left ventricular wall thickness. Left ventricular diastology could not be evaluated.  2. The mitral valve is degenerative. Mild thickening of the mitral valve leaflet.  3. The tricuspid valve is normal in structure.  4. The aortic valve is tricuspid.  5. The pulmonic valve was normal in structure.  6. There is mild dilatation of the aortic root and of the ascending aorta measuring 37 mm.  7. Severe hypokinesis of the left ventricular inferior wall.  8. The interatrial septum was not well visualized.  Patient Profile     68 y.o. male with a hx of hypertension, hyperlipidemia, type 2 diabetes mellitus, BPH, hypothyroidism, diverticulosis, and nephrolithiasis, presenting with  right arm pain and found to have elevated troponin concerning for NSTEMI.  Assessment & Plan    NSTEMI No further symptoms overnight.  Troponin peaked yesterday morning at 1.3.  Echo shows inferior hypokinesis but otherwise preserved LVEF.  Given these findings, I have recommended that we proceed with LHC and possible PCI today.  Plan for cath today.  Slight bump in creatinine noted; prehydration underway.  I have reviewed the risks, indications, and alternatives to cardiac catheterization, possible angioplasty, and stenting with the patient. Risks include but are not limited to bleeding, infection, vascular injury, stroke, myocardial infection, arrhythmia, kidney injury, radiation-related injury in the case of prolonged fluoroscopy use, emergency cardiac surgery, and death. The patient understands the risks of serious complication is 1-2 in 8295 with diagnostic cardiac cath and 1-2% or less with angioplasty/stenting.  Continue heparin  infusion pending catheterization.  Continue ASA, carvedilol, and atorvastatin.  Hypertension BP under better control overnight and this morning.  Continue current medications.  Hyperlipidemia LDL not at goal on admission.  Continue with increased dose of atorvastatin (40 mg daily).  For questions or updates, please contact [place] Please consult [email or web address] for contact info under [facility] Cardiology.     Signed, Nelva Bush, MD  07/09/2018, 7:25 AM

## 2018-07-09 NOTE — Progress Notes (Signed)
[facility] [facility] [place] NAME: Troy Rojas    MR#:  [id number]  DATE OF BIRTH:  02/25/1951  SUBJECTIVE:  CHIEF COMPLAINT:   Chief Complaint  Patient presents with  . Chest Pain   Resting in bed, slept well overnight. No chest pain, shortness of breath, recurrence of wrist/arm pain. Chronic nerve pain in legs. Seen by cardiology this AM, plan for left heart cath this afternoon.  REVIEW OF SYSTEMS:  CONSTITUTIONAL: No fever, fatigue or weakness.  EYES: No blurred or double vision.  EARS, NOSE, AND THROAT: No tinnitus or ear pain.  RESPIRATORY: No cough, shortness of breath, wheezing or hemoptysis.  CARDIOVASCULAR:No chest pain,noorthopnea, edema, palpitations. GASTROINTESTINAL: No nausea, vomiting, diarrhea or abdominal pain.  GENITOURINARY: No dysuria, hematuria.  ENDOCRINE: No polyuria, nocturia,  HEMATOLOGY: No anemia, easy bruising or bleeding SKIN: No rash or lesion. MUSCULOSKELETAL: Chronic neuropathic leg pain. No other joint pain. NEUROLOGIC: No tingling, numbness, weakness.  PSYCHIATRY: No anxiety or depression.  DRUG ALLERGIES:   Allergies  Allergen Reactions  . Benazepril Other (See Comments)    Other reaction(s): Tongue swelling  . Amitriptyline Other (See Comments)    Reaction unknown  . Duloxetine Hcl Other (See Comments)    Reaction unkown  . Pollen Extract Other (See Comments)    Sinus issues   VITALS:  Blood pressure (!) 125/91, pulse 89, temperature 98 F (36.7 C), temperature source Oral, resp. rate 19, height 5\' 6"  (1.676 m), weight 91.6 kg, SpO2 94 %. PHYSICAL EXAMINATION:  GENERAL:68 y.o.-year-old patient lying in the bed with no acute distress.  EYES: Pupils equal, round, reactive to light and accommodation. No scleral icterus. Extraocular muscles intact.  HEENT: Head atraumatic, normocephalic. Oropharynx and nasopharynx clear.  NECK: Supple, no jugular venous distention. No thyroid enlargement, no  tenderness.  LUNGS: Normal breath sounds bilaterally, no wheezing, rales, rhonchi or crepitation. No use of accessory muscles of respiration.  CARDIOVASCULAR: S1, S2 normal. No murmurs, rubs, or gallops.  ABDOMEN: Soft, nontender, nondistended. Bowel sounds present. No organomegaly or mass.  EXTREMITIES: No pedal edema, cyanosis, or clubbing.  NEUROLOGIC: Cranial nerves II through XII are intact. Muscle strength 5/5 in all extremities. Sensation intact. Gait not checked.  PSYCHIATRIC: The patient is alert and oriented x 3.  SKIN: No obvious rash, lesion, or ulcer.  LABORATORY PANEL:  Male CBC Recent Labs  Lab 07/09/18 0459  WBC 5.2  HGB 14.5  HCT 44.8  PLT 205   ------------------------------------------------------------------------------------------------------------------ Chemistries  Recent Labs  Lab 07/09/18 0459  NA 139  K 4.2  CL 102  CO2 27  GLUCOSE 159*  BUN 22  CREATININE 1.38*  CALCIUM 9.8   RADIOLOGY:   ECHO TTE (07/08/2018): Impressions: 1. The left ventricle has normal systolic function, with an ejection fraction of 55-60%. The cavity size was normal. There is mildly increased left ventricular wall thickness. Left ventricular diastology could not be evaluated. 2. The mitral valve is degenerative. Mild thickening of the mitral valve leaflet. 3. The tricuspid valve is normal in structure. 4. The aortic valve is tricuspid. 5. The pulmonic valve was normal in structure. 6. There is mild dilatation of the aortic root and of the ascending aorta measuring 37 mm. 7. Severe hypokinesis of the left ventricular inferior wall. 8. The interatrial septum was not well visualized.  ASSESSMENT AND PLAN:   Troy Klingbeil Smithis a 68 y.o.malewith a history of hypertension, hyperlipidemia, type 2 diabetes mellitus, BPH, hypothyroidism, diverticulosis, and nephrolithiasis,who was  admitted for evaluation of right arm pain and found to have elevated troponin, admitted for  NSTEMI management. ECHO showed severe hypokinesis of left ventricular inferior wall. Left heart cath 2/19-   *Non-ST elevation MI Monitor on telemetry and followed serial troponin which peaked yesterday AM TTE Echocardiogram completed: severe hypokinesis of the left ventricular inferior wall lipid panel: LDL 162 chol 234 hemoglobin A1c: 9.4% Followed by cardiology/Dr. End, plan for left cardiac cath today: heparin infusion (pharmacy following), aspirin, and carvedilol will be continued. atorvastatin 40 mg daily. Defer adding a P2Y12 inhibitor pending ischemia evaluation, in case CABG were needed.    *Hypertension Controlled. Continue home medications, amlodipine. Carvedilol added because of possible acute MI, continue. Monitor.  *Hyperlipidemia LDL not well controlled as above. Patient currently on omega-3 fatty acid and fenofibrate. Cardiology started atorvastatin 40 mg as above  *Diabetes Sliding scale insulin ordered. Continue home Metformin at discharge with PCP follow-up.  hemoglobin A1c: 9.4%   *Hypothyroidism Continue levothyroxine.  *BPH Continue Flomax and finasteride.  *Chronic pain Legs, sometimes abdomen. No pain today.  Continue pain management as needed. Patient follows with a pain clinic in [facility].    All the records are reviewed and case is discussed with Care Management/Social Worker. Management plans discussed with the patient and/or family and they are in agreement.  CODE STATUS: Full Code  TOTAL TIME TAKING CARE OF THIS PATIENT: 30 minutes.   More than 50% of the time was spent in counseling/coordination of care: YES  POSSIBLE D/C IN 1-2 DAYS, DEPENDING ON CLINICAL CONDITION.   Juliana Boling PA-C on 07/09/2018 at 1:42 PM  Between 7am to 6pm - Pager - [phone]  After 6 pm go to [email or web address] - Proofreader  [facility]  Office  [phone]  CC: Primary care physician; [place] Pink, MD  Note:  This dictation was prepared with Dragon dictation along with smaller phrase technology. Any transcriptional errors that result from this process are unintentional.

## 2018-07-09 NOTE — H&P (View-Only) (Signed)
Progress Note  Patient Name: Troy Rojas Date of Encounter: 07/09/2018  Primary Cardiologist: Formerly Ingal  Subjective   Feels well.  No chest pain, arm pain, or shortness of breath.  Only complaint is chronic left leg pain.  Inpatient Medications    Scheduled Meds: . allopurinol  300 mg Oral Daily  . amLODipine  5 mg Oral Daily  . aspirin EC  81 mg Oral Daily  . atorvastatin  40 mg Oral q1800  . carvedilol  3.125 mg Oral BID WC  . fenofibrate  160 mg Oral Daily  . finasteride  5 mg Oral Daily  . fluticasone  1 spray Each Nare Daily  . gabapentin  300 mg Oral TID  . levothyroxine  125 mcg Oral Daily  . loratadine  10 mg Oral Daily  . Melatonin  1 tablet Oral QHS  . nortriptyline  25 mg Oral Daily  . omega-3 acid ethyl esters  1 capsule Oral BID  . pantoprazole  40 mg Oral Daily  . tamsulosin  0.4 mg Oral Daily   Continuous Infusions: . sodium chloride 150 mL/hr at 07/09/18 0704  . heparin 1,100 Units/hr (07/09/18 0300)   PRN Meds: docusate sodium, morphine injection, nitroGLYCERIN, oxyCODONE, zolpidem   Vital Signs    Vitals:   07/08/18 1715 07/08/18 1745 07/08/18 1922 07/09/18 0552  BP: (!) 141/87 (!) 125/94 121/87 127/84  Pulse: (!) 114 (!) 104 94 75  Resp: (!) 22 18 16 16   Temp:   98.1 F (36.7 C) 98.3 F (36.8 C)  TempSrc:   Oral Oral  SpO2: 95% 95% 96% 95%  Weight:  92.3 kg  91.6 kg  Height:  5\' 6"  (1.676 m)      Intake/Output Summary (Last 24 hours) at 07/09/2018 0725 Last data filed at 07/09/2018 0600 Gross per 24 hour  Intake 746.9 ml  Output 1450 ml  Net -703.1 ml   Last 3 Weights 07/09/2018 07/08/2018 07/07/2018  Weight (lbs) 202 lb 203 lb 6.4 oz 210 lb  Weight (kg) 91.627 kg 92.262 kg 95.255 kg      Telemetry    NSR - Personally Reviewed  ECG    No new tracing.  Physical Exam   GEN: No acute distress.   Neck: No JVD Cardiac: RRR, no murmurs, rubs, or gallops.  Respiratory: Clear to auscultation bilaterally. GI: Soft,  nontender, non-distended  MS: No edema; No deformity. Neuro:  Nonfocal  Psych: Normal affect   Labs    Chemistry Recent Labs  Lab 07/07/18 1343 07/08/18 0746 07/09/18 0459  NA 134* 135 139  K 4.2 3.9 4.2  CL 99 102 102  CO2 25 27 27   GLUCOSE 167* 137* 159*  BUN 18 17 22   CREATININE 1.24 1.17 1.38*  CALCIUM 10.1 9.3 9.8  GFRNONAA 59* >60 52*  GFRAA >60 >60 >60  ANIONGAP 10 6 10      Hematology Recent Labs  Lab 07/07/18 1343 07/08/18 0746 07/09/18 0459  WBC 4.6 4.8 5.2  RBC 5.13 4.82 5.04  HGB 14.9 14.0 14.5  HCT 45.5 42.5 44.8  MCV 88.7 88.2 88.9  MCH 29.0 29.0 28.8  MCHC 32.7 32.9 32.4  RDW 13.5 13.6 13.9  PLT 219 201 205    Cardiac Enzymes Recent Labs  Lab 07/07/18 1633 07/07/18 2104 07/08/18 0322 07/08/18 0746  TROPONINI 0.14* 0.75* 1.28* 1.07*   No results for input(s): TROPIPOC in the last 168 hours.   BNPNo results for input(s): BNP, PROBNP in the last  168 hours.   DDimer No results for input(s): DDIMER in the last 168 hours.   Radiology    Dg Chest 2 View  Result Date: 07/07/2018 CLINICAL DATA:  Chest pain EXAM: CHEST - 2 VIEW COMPARISON:  None. FINDINGS: The heart size and mediastinal contours are within normal limits. Both lungs are clear. The visualized skeletal structures are unremarkable. IMPRESSION: No active cardiopulmonary disease. Electronically Signed   By: Donavan Foil M.D.   On: 07/07/2018 15:28   Ct Angio Chest Aorta W And/or Wo Contrast  Result Date: 07/07/2018 CLINICAL DATA:  68 year old male with history of right-sided arm pain since 1 p.m. today. No associated nausea or vomiting. Pain in the right upper chest. No shortness of breath. EXAM: CT ANGIOGRAPHY CHEST WITH CONTRAST TECHNIQUE: Multidetector CT imaging of the chest was performed using the standard protocol during bolus administration of intravenous contrast. Multiplanar CT image reconstructions and MIPs were obtained to evaluate the vascular anatomy. CONTRAST:  58mL  OMNIPAQUE IOHEXOL 350 MG/ML SOLN COMPARISON:  None. FINDINGS: Cardiovascular: Heart size is normal. There is no significant pericardial fluid, thickening or pericardial calcification. There is aortic atherosclerosis, as well as atherosclerosis of the great vessels of the mediastinum and the coronary arteries, including calcified atherosclerotic plaque in the left main, left anterior descending, left circumflex and right coronary arteries. Precontrast images demonstrate no crescentic high attenuation associated with the wall of the thoracic aorta to suggest acute intramural hemorrhage. No aneurysm or dissection of the thoracic aorta. Mediastinum/Nodes: No pathologically enlarged mediastinal or hilar lymph nodes. Esophagus is unremarkable in appearance. No axillary lymphadenopathy. Lungs/Pleura: No suspicious appearing pulmonary nodules or masses are noted. No acute consolidative airspace disease. No pleural effusions. Upper Abdomen: Low-attenuation lesions in the upper abdomen, incompletely imaged, but correlating to large renal cysts noted on prior CT the abdomen and pelvis 12/21/2014. Diffuse low attenuation throughout the visualized portions of the hepatic parenchyma, indicative of hepatic steatosis. Musculoskeletal: There are no aggressive appearing lytic or blastic lesions noted in the visualized portions of the skeleton. Review of the MIP images confirms the above findings. IMPRESSION: 1. No acute abnormality of the thoracic aorta. 2. Aortic atherosclerosis, in addition to left main and 3 vessel coronary artery disease. Please note that although the presence of coronary artery calcium documents the presence of coronary artery disease, the severity of this disease and any potential stenosis cannot be assessed on this non-gated CT examination. Assessment for potential risk factor modification, dietary therapy or pharmacologic therapy may be warranted, if clinically indicated. 3. Hepatic steatosis. Aortic  Atherosclerosis (ICD10-I70.0). Electronically Signed   By: Vinnie Langton M.D.   On: 07/07/2018 18:42    Cardiac Studies   TTE (07/08/2018):  1. The left ventricle has normal systolic function, with an ejection fraction of 55-60%. The cavity size was normal. There is mildly increased left ventricular wall thickness. Left ventricular diastology could not be evaluated.  2. The mitral valve is degenerative. Mild thickening of the mitral valve leaflet.  3. The tricuspid valve is normal in structure.  4. The aortic valve is tricuspid.  5. The pulmonic valve was normal in structure.  6. There is mild dilatation of the aortic root and of the ascending aorta measuring 37 mm.  7. Severe hypokinesis of the left ventricular inferior wall.  8. The interatrial septum was not well visualized.  Patient Profile     68 y.o. male with a hx of hypertension, hyperlipidemia, type 2 diabetes mellitus, BPH, hypothyroidism, diverticulosis, and nephrolithiasis, presenting with  right arm pain and found to have elevated troponin concerning for NSTEMI.  Assessment & Plan    NSTEMI No further symptoms overnight.  Troponin peaked yesterday morning at 1.3.  Echo shows inferior hypokinesis but otherwise preserved LVEF.  Given these findings, I have recommended that we proceed with LHC and possible PCI today.  Plan for cath today.  Slight bump in creatinine noted; prehydration underway.  I have reviewed the risks, indications, and alternatives to cardiac catheterization, possible angioplasty, and stenting with the patient. Risks include but are not limited to bleeding, infection, vascular injury, stroke, myocardial infection, arrhythmia, kidney injury, radiation-related injury in the case of prolonged fluoroscopy use, emergency cardiac surgery, and death. The patient understands the risks of serious complication is 1-2 in 8184 with diagnostic cardiac cath and 1-2% or less with angioplasty/stenting.  Continue heparin  infusion pending catheterization.  Continue ASA, carvedilol, and atorvastatin.  Hypertension BP under better control overnight and this morning.  Continue current medications.  Hyperlipidemia LDL not at goal on admission.  Continue with increased dose of atorvastatin (40 mg daily).  For questions or updates, please contact [place] Please consult [email or web address] for contact info under [facility] Cardiology.     Signed, Nelva Bush, MD  07/09/2018, 7:25 AM

## 2018-07-09 NOTE — Discharge Summary (Signed)
[facility] [place] NAME: Troy Rojas    MR#:  [id number]  DATE OF BIRTH:  May 05, 1951  DATE OF ADMISSION:  07/07/2018   ADMITTING PHYSICIAN: Vaughan Basta, MD  DATE OF DISCHARGE: 07/09/2018  PRIMARY CARE PHYSICIAN: [place] Pink, MD   ADMISSION DIAGNOSIS:  Elevated troponin I level [R79.89] Acute pain of right shoulder [M25.511] DISCHARGE DIAGNOSIS:  Principal Problem:   NSTEMI (non-ST elevated myocardial infarction) ([place])  SECONDARY DIAGNOSIS:   Past Medical History:  Diagnosis Date  . BPH (benign prostatic hyperplasia)   . Diabetes mellitus without complication ([place])   . Diverticulosis   . Dysuria   . History of kidney stones   . HLD (hyperlipidemia)   . Hx of gout   . Hydronephrosis   . Hypertension   . Hypothyroidism   . Insomnia   . Insomnia   . Marginal ulcers   . Neuromuscular disorder ([place])   . Nocturia   . Over weight   . Prostatitis   . Renal stones   . Testicular hypofunction   . Thyroid disease   . Ulcer   . Urinary frequency    HOSPITAL COURSE:   Troy Rojas a 67 y.o.malewith a historyof hypertension, hyperlipidemia, type 2 diabetes mellitus, BPH, hypothyroidism, diverticulosis, and nephrolithiasis,whowas admitted forevaluation of right arm pain andfound to haveelevated troponin, admitted for NSTEMI management. ECHO showed severe hypokinesis of left ventricular inferior wall. Left heart cath 2/19 - no stent placed, no angioplasty performed. Full results and cardiology recommendations are copied at the bottom of this DC summary.  * Non-ST elevation MI Monitored on telemetry and followedserial troponin which peaked 2/18 in the AM.  TTE Echocardiogramcompleted: severe hypokinesis of the left ventricular inferior wall lipid panel: LDL 162 chol 234 hemoglobin A1c: 9.4% Followed by cardiology/Dr. End, left cardiac cath 2/19: no stent placed. No angioplasty performed. Cleared for  discharge. heparin infusion (pharmacy followed) while admitted  - aspirin, and carvedilol will be continued. atorvastatin 40 mg daily. Plavix 75 mg daily. - Per cardiology, dual anti-platelet therapy should be continued for 12 months. See full cath report and recommendations at the bottom of this DC summary. - NTG PRN for chest pain - Patient reports he has a history of GI bleed 30 years ago and cannot take aspirin. Cardiology recommends aspirin, patient will need to follow-up with PCP closely after discharge and cardiology within 2 weeks to reassess long-term management. - Repeat BMP within 1 week to reassess renal function.  * Hypertension Controlled. Continue home medications, amlodipine. Carvediloladdedbecause of acute NSTEMI, continue.  Monitor with PCP and cardiology follow-up.  * Hyperlipidemia LDL not well controlled as above. Patient currently on omega-3 fatty acid and fenofibrate.Cardiology startedatorvastatin 40mg  as above. Will defer to outpatient follow-up for cardiology's recommendation to discontinue fenofibrate and manage with statin.   * Diabetes Sliding scale insulin ordered while admitted. Continue home Metformin at discharge and continue management with PCP follow-up.  (hemoglobin A1c: 9.4%)  * Hypothyroidism Continue levothyroxine.  * BPH Continue Flomax and finasteride.  * Chronic pain Legs, sometimes abdomen. No pain today.  Continue pain management as needed. Patient follows with a pain clinic in [facility]. Has April 30th appointment with the pain clinic already scheduled.  * Gout, nephrolithiasis  Continue allopurinol    DISCHARGE CONDITIONS:  stable CONSULTS OBTAINED:  Treatment Team:  Wellington Hampshire, MD DRUG ALLERGIES:   Allergies  Allergen Reactions  . Benazepril Other (See Comments)  Other reaction(s): Tongue swelling  . Amitriptyline Other (See Comments)    Reaction unknown  . Duloxetine Hcl Other (See Comments)     Reaction unkown  . Pollen Extract Other (See Comments)    Sinus issues   DISCHARGE MEDICATIONS:   Allergies as of 07/09/2018      Reactions   Benazepril Other (See Comments)   Other reaction(s): Tongue swelling   Amitriptyline Other (See Comments)   Reaction unknown   Duloxetine Hcl Other (See Comments)   Reaction unkown   Pollen Extract Other (See Comments)   Sinus issues      Medication List    STOP taking these medications   Flaxseed Oil Oil   NONFORMULARY OR COMPOUNDED ITEM     TAKE these medications   allopurinol 300 MG tablet Commonly known as:  ZYLOPRIM Take 1 tablet by mouth daily.   amLODipine 10 MG tablet Commonly known as:  NORVASC Take 10 mg by mouth daily.   aspirin 81 MG EC tablet Take 1 tablet (81 mg total) by mouth daily. Start taking on:  July 10, 2018   atorvastatin 40 MG tablet Commonly known as:  LIPITOR Take 1 tablet (40 mg total) by mouth daily at 6 PM.   carvedilol 3.125 MG tablet Commonly known as:  COREG Take 1 tablet (3.125 mg total) by mouth 2 (two) times daily with a meal.   cetirizine 5 MG tablet Commonly known as:  ZYRTEC Take 10 mg by mouth daily as needed.   CLARITIN-D 24 HOUR 10-240 MG 24 hr tablet Generic drug:  loratadine-pseudoephedrine Take 1 tablet by mouth daily as needed for allergies.   clopidogrel 75 MG tablet Commonly known as:  PLAVIX Take 1 tablet (75 mg total) by mouth daily with breakfast. Start taking on:  July 10, 2018   docusate sodium 100 MG capsule Commonly known as:  COLACE Take 100 mg by mouth daily as needed for mild constipation or moderate constipation.   fenofibrate micronized 134 MG capsule Commonly known as:  LOFIBRA Take 1 capsule by mouth at bedtime.   finasteride 5 MG tablet Commonly known as:  PROSCAR Take 1 tablet (5 mg total) by mouth daily.   fluticasone 50 MCG/ACT nasal spray Commonly known as:  FLONASE Place 2 sprays into both nostrils daily as needed for allergies or  rhinitis.   gabapentin 600 MG tablet Commonly known as:  NEURONTIN Take 600 mg by mouth 3 (three) times daily.   levothyroxine 125 MCG tablet Commonly known as:  SYNTHROID, LEVOTHROID Take 125 mcg by mouth daily.   Melatonin 5 MG Tabs Take 1 tablet by mouth daily.   nitroGLYCERIN 0.4 MG SL tablet Commonly known as:  NITROSTAT Place 1 tablet (0.4 mg total) under the tongue every 5 (five) minutes as needed for chest pain.   nortriptyline 25 MG capsule Commonly known as:  PAMELOR Take 25 mg by mouth at bedtime.   omega-3 acid ethyl esters 1 g capsule Commonly known as:  LOVAZA Take 1 capsule by mouth 2 (two) times daily.   Oxycodone HCl 10 MG Tabs Take 10 tablets by mouth See admin instructions. Take 1 tablet (10mg ) by mouth five to six times daily as needed for pain   pantoprazole 40 MG tablet Commonly known as:  PROTONIX Take 40 mg by mouth daily.   senna-docusate 8.6-50 MG tablet Commonly known as:  Senokot-S Take 1 tablet by mouth daily as needed for mild constipation.   tamsulosin 0.4 MG Caps capsule Commonly known  as:  FLOMAX TAKE 1 CAPSULE BY MOUTH ONCE A DAY   zolpidem 10 MG tablet Commonly known as:  AMBIEN Take 10 mg by mouth at bedtime as needed for sleep.       DISCHARGE INSTRUCTIONS:   DIET:  Cardiac diet and Diabetic diet DISCHARGE CONDITION:  Stable ACTIVITY:  Activity as tolerated OXYGEN:  Home Oxygen: No.  Oxygen Delivery: room air DISCHARGE LOCATION:  home   If you experience worsening of your admission symptoms, develop shortness of breath, life threatening emergency, suicidal or homicidal thoughts you must seek medical attention immediately by calling 911 or calling your MD immediately if your symptoms are severe.  You Must read complete instructions/literature along with all the possible adverse reactions/side effects for all the medicines you take and that have been prescribed to you. Take any new medicines only after you have  completely understood and accept all the possible adverse reactions/side effects.   Please note  You were cared for by a hospitalist during your hospital stay. If you have any questions about your discharge medications or the care you received while you were in the hospital after you are discharged, you can call the unit and asked to speak with the hospitalist on call if the hospitalist that took care of you is not available. Once you are discharged, your primary care physician will handle any further medical issues. Please note that NO REFILLS for any discharge medications will be authorized once you are discharged, as it is imperative that you return to your primary care physician (or establish a relationship with a primary care physician if you do not have one) for your aftercare needs so that they can reassess your need for medications and monitor your lab values.   On the day of Discharge:  VITAL SIGNS:  Blood pressure 115/78, pulse 71, temperature 97.9 F (36.6 C), temperature source Oral, resp. rate 18, height 5\' 6"  (1.676 m), weight 91.6 kg, SpO2 97 %. PHYSICAL EXAMINATION:  GENERAL:68 y.o.-year-old patient lying in the bed with no acute distress.  EYES: Pupils equal, round, reactive to light and accommodation. No scleral icterus. Extraocular muscles intact.  HEENT: Head atraumatic, normocephalic. Oropharynx and nasopharynx clear.  NECK: Supple, no jugular venous distention. No thyroid enlargement, no tenderness.  LUNGS: Normal breath sounds bilaterally, no wheezing, rales, rhonchi or crepitation. No use of accessory muscles of respiration.  CARDIOVASCULAR: S1, S2 normal. No murmurs, rubs, or gallops.  ABDOMEN: Soft, nontender, nondistended. Bowel sounds present. No organomegaly or mass.  EXTREMITIES: No pedal edema, cyanosis, or clubbing.  NEUROLOGIC: Cranial nerves II through XII are intact. Muscle strength 5/5 in all extremities. Sensation intact. Gait not checked.  PSYCHIATRIC:  The patient is alert and oriented x 3.  SKIN: No obvious rash, lesion, or ulcer. DATA REVIEW:   CBC Recent Labs  Lab 07/09/18 0459  WBC 5.2  HGB 14.5  HCT 44.8  PLT 205    Chemistries  Recent Labs  Lab 07/09/18 0459  NA 139  K 4.2  CL 102  CO2 27  GLUCOSE 159*  BUN 22  CREATININE 1.38*  CALCIUM 9.8    RADIOLOGY:  No results found.  LEFT HEART CATH AND CORONARY ANGIOGRAPHY 07/09/2018  Conclusion   Conclusions: 1. Multivessel coronary artery disease, as detailed below.  Culprit lesion for the patient's NSTEMI is most likely the RCA, which is small (<2 mm in diameter) and not a suitable target for PCI.  Occlusion of the D1 appears chronic with left-to-left collaterals. 2.  Moderate CAD involving proximal/mid LAD and mid LCx with significant calcification. 3. Moderate to severe AV groove LCx with 70%-80% stenosis after takeoff of large OM2 branch. 4. Mildly elevated left ventricular filling pressure.  Recommendations: 1. Medical therapy, including carvedilol and high-intensity statin therapy. 2. Dual antiplatelet therapy with aspirin and clopidogrel for 12 months. 3. Patient should be discharged with a prescription for prn NTG.  If he has recurrent pain despite being on maximal antianginal therapy, FFR-guided PCI to the LAD/LCx versus CABG would need to be considered. 4. If patient remains pain free and has no post-cath complications in recovery, he could be discharged home this afternoon.  He will need a BMP in ~1 week to reassess his renal function given.    Management plans discussed with the patient and/or family and they are in agreement.  CODE STATUS: Full Code   TOTAL TIME TAKING CARE OF THIS PATIENT: 45 minutes.    Ripley Fraise PA-C on 07/09/2018 at 4:38 PM  Between 7am to 6pm - Pager - [phone]  After 6pm go to [email or web address] - Proofreader  [facility]  Hospitalists  Office  [phone]  CC: Primary care physician; [place] Pink, MD

## 2018-07-10 LAB — GLUCOSE, CAPILLARY: Glucose-Capillary: 134 mg/dL — ABNORMAL HIGH (ref 70–99)

## 2018-07-15 ENCOUNTER — Ambulatory Visit: Payer: Medicare Other

## 2018-07-15 ENCOUNTER — Ambulatory Visit (INDEPENDENT_AMBULATORY_CARE_PROVIDER_SITE_OTHER): Payer: Medicare Other

## 2018-07-15 DIAGNOSIS — I214 Non-ST elevation (NSTEMI) myocardial infarction: Secondary | ICD-10-CM

## 2018-07-15 DIAGNOSIS — I10 Essential (primary) hypertension: Secondary | ICD-10-CM

## 2018-07-16 LAB — BASIC METABOLIC PANEL
BUN / CREAT RATIO: 17 (ref 10–24)
BUN: 22 mg/dL (ref 8–27)
CHLORIDE: 97 mmol/L (ref 96–106)
CO2: 22 mmol/L (ref 20–29)
Calcium: 10 mg/dL (ref 8.6–10.2)
Creatinine, Ser: 1.28 mg/dL — ABNORMAL HIGH (ref 0.76–1.27)
GFR calc Af Amer: 66 mL/min/{1.73_m2} (ref >59)
GFR calc non Af Amer: 57 mL/min/{1.73_m2} — ABNORMAL LOW (ref >59)
Glucose: 166 mg/dL — ABNORMAL HIGH (ref 65–99)
Potassium: 4.6 mmol/L (ref 3.5–5.2)
Sodium: 138 mmol/L (ref 134–144)

## 2018-07-29 ENCOUNTER — Encounter: Payer: Self-pay | Admitting: Nurse Practitioner

## 2018-07-29 ENCOUNTER — Ambulatory Visit (INDEPENDENT_AMBULATORY_CARE_PROVIDER_SITE_OTHER): Payer: Medicare Other | Admitting: Nurse Practitioner

## 2018-07-29 VITALS — BP 110/80 | HR 104 | Ht 66.0 in | Wt 208.0 lb

## 2018-07-29 DIAGNOSIS — I214 Non-ST elevation (NSTEMI) myocardial infarction: Secondary | ICD-10-CM

## 2018-07-29 DIAGNOSIS — E781 Pure hyperglyceridemia: Secondary | ICD-10-CM

## 2018-07-29 DIAGNOSIS — I10 Essential (primary) hypertension: Secondary | ICD-10-CM

## 2018-07-29 DIAGNOSIS — M79605 Pain in left leg: Secondary | ICD-10-CM | POA: Diagnosis not present

## 2018-07-29 DIAGNOSIS — E785 Hyperlipidemia, unspecified: Secondary | ICD-10-CM

## 2018-07-29 DIAGNOSIS — I2510 Atherosclerotic heart disease of native coronary artery without angina pectoris: Secondary | ICD-10-CM | POA: Diagnosis not present

## 2018-07-29 MED ORDER — CLOPIDOGREL BISULFATE 75 MG PO TABS: 75.0000 mg | ORAL_TABLET | Freq: Every day | ORAL | 3 refills | Status: DC

## 2018-07-29 MED ORDER — ATORVASTATIN CALCIUM 40 MG PO TABS: 40.0000 mg | ORAL_TABLET | Freq: Every day | ORAL | 3 refills | Status: DC

## 2018-07-29 MED ORDER — CARVEDILOL 6.25 MG PO TABS: 6.2500 mg | ORAL_TABLET | Freq: Two times a day (BID) | ORAL | 3 refills | Status: DC

## 2018-07-29 MED ORDER — AMLODIPINE BESYLATE 5 MG PO TABS: 5.0000 mg | ORAL_TABLET | Freq: Every day | ORAL | 3 refills | Status: DC

## 2018-07-29 NOTE — Progress Notes (Signed)
Office Visit    Patient Name: Troy Rojas Date of Encounter: 07/29/2018  Primary Care Provider:  [place] Pink, MD Primary Cardiologist:  Nelva Bush, MD  Chief Complaint    68 y.o. male with a past medical history of hypertension, hyperlipidemia, hypothyroidism, plantar fascitis, and peripheral neuropathy who presents for a follow-up of after recent hospitalization for non-STEMI.  Past Medical History    Past Medical History:  Diagnosis Date   Aortic root dilatation ([place])    a. 06/2018 Echo: Mild dil of Ao root - asc Ao 39mm.   BPH (benign prostatic hyperplasia)    CAD (coronary artery disease)    a. 06/2018 NSTEMI/Cath: LM nl, LAD 60ost/m, D1 100 CTO, D2 40, LCX 60p/m, 35m, RCA small, 51m. Med Rx.   Diabetes mellitus without complication ([place])    Diverticulosis    Dysuria    History of kidney stones    HLD (hyperlipidemia)    Hx of gout    Hydronephrosis    Hypertension    Hypothyroidism    Hypothyroidism    Insomnia    Ischemic cardiomyopathy    a. 06/2018 Echo: EF 55-60%, sev inf HK.   Marginal ulcers    Neuromuscular disorder ([facility])    Nocturia    Over weight    Prostatitis    Testicular hypofunction    Ulcer    Urinary frequency    Past Surgical History:  Procedure Laterality Date   APPENDECTOMY     HEMORRHOID SURGERY     LEFT HEART CATH AND CORONARY ANGIOGRAPHY N/A 07/09/2018   Procedure: LEFT HEART CATH AND CORONARY ANGIOGRAPHY;  Surgeon: Nelva Bush, MD;  Location: [facility];  Service: Cardiovascular;  Laterality: N/A;   LITHOTRIPSY     x 3   NASAL SINUS SURGERY     STOMACH SURGERY     VASECTOMY      Allergies  Allergies  Allergen Reactions   Benazepril Other (See Comments)    Other reaction(s): Tongue swelling   Amitriptyline Other (See Comments)    Reaction unknown   Duloxetine Hcl Other (See Comments)    Reaction unkown   Pollen Extract Other (See Comments)    Sinus issues     History of Present Illness    68 y.o. male with the above past medical history of hypertension, bleeding ulcers, hyperlipidemia, hypothyroidism, plantar fasciitis, restless leg syndrome and peripheral neuropathy. He was recently evaluated in the ED on 07/07/2018 for right wrist/hand pain that radiated to his right shoulder and Right upper chest.  CT angiography of the chest was negative for dissection, though multivessel coronary atherosclerosis was noted.  Initial troponin was 0.03 but subsequently rose to 1.28 following admission.  He was seen by our team.  Echocardiogram showed normal LV function with severe inferior hypokinesis.  In the setting of non-STEMI, he underwent diagnostic catheterization which showed moderate to severe multivessel disease.  It was felt that the culprit was most likely an 85% stenosis in a small (2 mm) right coronary artery.  Medical therapy was recommended including aspirin, Plavix, beta-blocker, and statin therapy.  He was subsequently discharged.  Since his discharge, he has done reasonably well.  He has been walking some each day, though activity is limited by peripheral neuropathy and plantar fasciitis.  He does play golf though rides in the cart.  With routine walking, he does not typically experience any symptoms or limitations but noted on a few occasions since his discharge, that he had  very mild right shoulder discomfort occurring at some point after exerting himself.  He provides an example of working to install a microwave oven under some cabinets and while doing so, he did not have any symptoms but later on while he was sitting he noticed just mild discomfort.  No associated dyspnea.  He did not require nitroglycerin and symptoms resolved after a short period of time.  Discomfort is not any worse with position changes, palpation or rotation of his shoulder.  In all, he does not think symptoms are too bothersome and not anywhere close to how severe they were when he  presented with his non-STEMI.  In further discussion today, he adds that he wakes up frequently at 5 am with left lower leg pain (denies pain when he walks, pain unchanged by heating pads and analgesics); he was referred to [place] [facility] vascular in the past but ended up not having recommended testing as it was not available the day he went to see them.  He is interested in ABIs.   Home Medications    Prior to Admission medications   Medication Sig St art Date End Date Taking? Authorizing Provider  allopurinol (ZYLOPRIM) 300 MG tablet Take 1 tablet by mouth daily. 11/14/15   [provider]  amLODipine (NORVASC) 10 MG tablet Take 10 mg by mouth daily.     [provider]  aspirin EC 81 MG EC tablet Take 1 tablet (81 mg total) by mouth daily. 07/10/18   Ripley Fraise, PA  atorvastatin (LIPITOR) 40 MG tablet Take 1 tablet (40 mg total) by mouth daily at 6 PM. 07/09/18   Lule, Joana, PA  carvedilol (COREG) 3.125 MG tablet Take 1 tablet (3.125 mg total) by mouth 2 (two) times daily with a meal. 07/09/18   Lule, Joana, PA  cetirizine (ZYRTEC) 5 MG tablet Take 10 mg by mouth daily as needed.     [provider]  clopidogrel (PLAVIX) 75 MG tablet Take 1 tablet (75 mg total) by mouth daily with breakfast. 07/10/18   Ripley Fraise, PA  docusate sodium (COLACE) 100 MG capsule Take 100 mg by mouth daily as needed for mild constipation or moderate constipation.     [provider]  fenofibrate micronized (LOFIBRA) 134 MG capsule Take 1 capsule by mouth at bedtime. 11/14/15   [provider]  finasteride (PROSCAR) 5 MG tablet Take 1 tablet (5 mg total) by mouth daily. 01/25/15   Zara Council A, PA-C  fluticasone (FLONASE) 50 MCG/ACT nasal spray Place 2 sprays into both nostrils daily as needed for allergies or rhinitis.     [provider]  gabapentin (NEURONTIN) 600 MG tablet Take 600 mg by mouth 3 (three) times daily.     [provider]  levothyroxine  (SYNTHROID, LEVOTHROID) 125 MCG tablet Take 125 mcg by mouth daily.     [provider]  loratadine-pseudoephedrine (CLARITIN-D 24 HOUR) 10-240 MG 24 hr tablet Take 1 tablet by mouth daily as needed for allergies.     [provider]  Melatonin 5 MG TABS Take 1 tablet by mouth daily.    [provider]  nitroGLYCERIN (NITROSTAT) 0.4 MG SL tablet Place 1 tablet (0.4 mg total) under the tongue every 5 (five) minutes as needed for chest pain. 07/09/18   Ripley Fraise, PA  nortriptyline (PAMELOR) 25 MG capsule Take 25 mg by mouth at bedtime.     [provider]  omega-3 acid ethyl esters (LOVAZA) 1 g capsule Take 1  capsule by mouth 2 (two) times daily. 09/02/17   [provider]  Oxycodone HCl 10 MG TABS Take 10 tablets by mouth See admin instructions. Take 1 tablet (10mg ) by mouth five to six times daily as needed for pain    [provider]  pantoprazole (PROTONIX) 40 MG tablet Take 40 mg by mouth daily.    [provider]  senna-docusate (SENOKOT-S) 8.6-50 MG tablet Take 1 tablet by mouth daily as needed for mild constipation.     [provider]  tamsulosin (FLOMAX) 0.4 MG CAPS capsule TAKE 1 CAPSULE BY MOUTH ONCE A DAY Patient taking differently: Take 0.4 mg by mouth daily.  06/20/15   Zara Council A, PA-C  zolpidem (AMBIEN) 10 MG tablet Take 10 mg by mouth at bedtime as needed for sleep.     [provider]    Review of Systems    He reports mild right shoulder and wrist pain. Also complains of left leg pain unchanged by heating pads and analgesics. He denies chest pain, palpitations, dyspnea, pnd, orthopnea, n, v, dizziness, syncope, edema, weight gain, or early satiety. All other systems reviewed and are otherwise negative except as noted above.   Physical Exam    VS:  BP 110/80 (BP Location: Left Arm, Patient Position: Sitting, Cuff Size: Normal)    Pulse (!) 104    Ht 5\' 6"  (1.676 m)    Wt 208 lb (94.3 kg)     BMI 33.57 kg/m  , BMI Body mass index is 33.57 kg/m. GEN: Well nourished, well developed, in no acute distress. HEENT: normal. Neck: Supple, no JVD, carotid bruits, or masses. Cardiac: RRR, no murmurs, rubs, or gallops. No clubbing, cyanosis, edema.  Radials/PT 2+ and equal bilaterally.  Right radial catheterization site without bleeding, bruit, or hematoma. Respiratory:  Respirations regular and unlabored, clear to auscultation bilaterally. GI: Soft, nontender, nondistended, BS + x 4. MS: no deformity or atrophy. Skin: warm and dry, no rash. Neuro:  Strength and sensation are intact. Psych: Normal affect.  Accessory Clinical Findings    ECG personally reviewed by me today -sinus tachycardia, 104, PAC- no acute changes.  Lab Results  Component Value Date   CREATININE 1.28 (H) 07/15/2018   BUN 22 07/15/2018   NA 138 07/15/2018   K 4.6 07/15/2018   CL 97 07/15/2018   CO2 22 07/15/2018    Assessment & Plan    1.  Non-STEMI, subsequent episode of care/CAD: Status post recent hospitalization for right wrist, arm, shoulder and right upper chest discomfort with finding of troponin elevation.  Echocardiogram showed normal LV function with inferior hypokinesis.  Catheterization showed moderate to severe multivessel disease with presumed culprit of a small mid right coronary artery.  Medical therapy was recommended though it was felt that if he had residual symptoms, FFR guided PCI of the LAD and/or circumflex could be considered.  Since discharge, he has been doing reasonably well.  He occasionally notes very mild-1-2 over 10 right shoulder discomfort that does not occur with activity but might occur at some point following activity without associated symptoms, and lasting just a few minutes.  Overall symptoms much improved compared to his MI symptoms.  Blood pressure stable today though heart rate elevated at 104.  Ongoing reduce his amlodipine to 5 mg daily in order to make room for titration  of carvedilol to 6.25 mg twice daily.  He otherwise remains on aspirin, statin, and Plavix therapy and is tolerating these well.  He  has been walking some without limitations and might be interested in cardiac rehabilitation.  2.  Essential hypertension: Stable.  As above, adjusting amlodipine and carvedilol in order to address elevated heart rate.  3.  Hyperlipidemia/hypertriglyceridemia: LDL of 162 on recent evaluation in February.  He is now on Lipitor 40 mg daily in addition to fenofibrate.  Follow-up lipids and LFTs in approximately 1 month.  Could consider Vascepa at some point in the future, though it is notable that on current therapy, triglycerides are less than 150.  4.  Mild renal insufficiency: Noted post CTA and catheterization in February.  Follow-up creatinine November 25 was stable at 1.28.  5.  Left leg pain: Patient reports a long history of anterior and medial left lower leg discomfort.  This does not nicely worsen with activity.  He is concerned he may have peripheral arterial disease.  He was previously referred to vein and vascular but never underwent recommended work-up.  He has good distal pulses.  I will arrange for ABIs/duplex.  6.  Disposition: Follow-up ABIs/lower extremity duplex.  Follow-up lipids and LFTs in approximately 1 month.  Follow-up in clinic in approximately 1 month or sooner if necessary.  Yetta Glassman am acting as a Education administrator for Murray Hodgkins, NP.  I have reviewed the above documentation for accuracy and completeness, and I agree with the above.   Murray Hodgkins, NP 07/29/2018, 4:54 PM

## 2018-07-29 NOTE — Patient Instructions (Signed)
Medication Instructions:   Your physician has recommended you make the following change in your medication:  1- DECREASE Amlodipine Take 1 tablet (5 mg total) by mouth daily 2- INCREASE Coreg Take 1 tablet (6.25 mg total) by mouth 2 (two) times daily with a meal If you need a refill on your cardiac medications before your next appointment, please call your pharmacy.   Lab work: Your physician recommends that you return to clinic for lab work in: 4 weeks on ___________ @__________AM /PM. Labs included are; fasting lipid and LFT. We will contact you with results in 1-2 business days.   If you have labs (blood work) drawn today and your tests are completely normal, you will receive your results only by: Marland Kitchen MyChart Message (if you have MyChart) OR . A paper copy in the mail If you have any lab test that is abnormal or we need to change your treatment, we will call you to review the results.  Testing/Procedures: 1- LE arterial Duplex and ABI Your physician has requested that you have a lower or upper extremity arterial duplex. This test is an ultrasound of the arteries in the legs or arms. It looks at arterial blood flow in the legs and arms. Allow one hour for Lower and Upper Arterial scans. There are no restrictions or special instructions   Follow-Up: At [facility], you and your health needs are our priority.  As part of our continuing mission to provide you with exceptional heart care, we have created designated Provider Care Teams.  These Care Teams include your primary Cardiologist (physician) and Advanced Practice Providers (APPs -  Physician Assistants and Nurse Practitioners) who all work together to provide you with the care you need, when you need it. You will need a follow up appointment in 1 months.  Please see Nelva Bush, MD

## 2018-08-25 ENCOUNTER — Telehealth: Payer: Self-pay | Admitting: Internal Medicine

## 2018-08-25 NOTE — Telephone Encounter (Signed)
Patient needs lab, arterial and OV rescheduled Would like to wait til the COVID19 restrictions lighten to schedule

## 2018-08-26 ENCOUNTER — Other Ambulatory Visit: Payer: Medicare Other

## 2018-08-28 ENCOUNTER — Telehealth: Payer: Self-pay | Admitting: *Deleted

## 2018-08-28 NOTE — Telephone Encounter (Signed)
-----   Message from Nelva Bush, MD sent at 08/22/2018 10:13 AM EDT ----- Regarding: RE: Please review for appt I think it is reasonable to delay labs and arterial Dopplers for now.  We will move forward with virtual visit as planned and decide on urgency of aforementioned tests based on how he feels at that time.  Gerald Stabs ----- Message ----- From: Vanessa Ralphs, RN Sent: 08/22/2018   9:47 AM EDT To: Nelva Bush, MD, Britt Bottom, CMA Subject: Please review for appt                         Hi Dr End,  Patient has Lipid and Liver lab work due. He also was supposed to get LEA dopplers prior to seeing you. Please advise on lab work and can the LEA's be delayed? We can plan to change to virtual visit if able.  Thanks, Anderson Malta, RN ----- Message ----- From: Britt Bottom, [place] Sent: 08/22/2018   9:23 AM EDT To: Vanessa Ralphs, RN  Pt has upcoming appointment w/Dr.End 4/13 pt has future LE u/s and labs before scheduled appointment. Please advise if pt needs to be converted to Virtual and what pt should do about upcoming testing?  Thank you, Lenda Kelp

## 2018-08-28 NOTE — Telephone Encounter (Signed)
This was all done.  

## 2018-09-01 ENCOUNTER — Ambulatory Visit: Payer: Medicare Other | Admitting: Internal Medicine

## 2018-09-29 ENCOUNTER — Ambulatory Visit (INDEPENDENT_AMBULATORY_CARE_PROVIDER_SITE_OTHER): Payer: Medicare Other | Admitting: Podiatry

## 2018-09-29 ENCOUNTER — Other Ambulatory Visit: Payer: Self-pay

## 2018-09-29 ENCOUNTER — Encounter: Payer: Self-pay | Admitting: Podiatry

## 2018-09-29 VITALS — Temp 96.1°F

## 2018-09-29 DIAGNOSIS — M722 Plantar fascial fibromatosis: Secondary | ICD-10-CM | POA: Diagnosis not present

## 2018-09-29 NOTE — Progress Notes (Signed)
He presents today chief complaint of plantar fasciitis bilaterally states that the last injections did really well until about 2 weeks ago he states now his feet are hurting again.  Objective: Vital signs are stable he is alert and oriented x3.  Pulses are palpable bilateral.  He has pain on palpation medial calcaneal tubercles bilaterally.  Assessment: Plantar fasciitis bilateral.  Plan: Discussed etiology pathology and surgical therapies at this point after sterile Betadine skin prep I injected 20 mg of Kenalog 5 mg Marcaine point maximal tenderness.  Tolerated procedure well without complications.  Follow-up with him in 3 months

## 2018-11-25 NOTE — Telephone Encounter (Signed)
lmov to schedule testing prior to August appt

## 2018-12-31 ENCOUNTER — Ambulatory Visit (INDEPENDENT_AMBULATORY_CARE_PROVIDER_SITE_OTHER): Payer: Medicare Other | Admitting: Podiatry

## 2018-12-31 ENCOUNTER — Other Ambulatory Visit: Payer: Self-pay

## 2018-12-31 ENCOUNTER — Encounter: Payer: Self-pay | Admitting: Podiatry

## 2018-12-31 VITALS — Temp 96.1°F

## 2018-12-31 DIAGNOSIS — M722 Plantar fascial fibromatosis: Secondary | ICD-10-CM

## 2018-12-31 NOTE — Progress Notes (Signed)
He presents today for a follow-up of his plantar fasciitis states that he needs another set of injections.  Objective: Vitals are stable alert and oriented x3 pulses are palpable.  No open lesions or wounds are noted.  Neurologic sensorium is intact he has been palpation medial calcaneal tubercles bilateral.  Assessment: Pain limb secondary to plan fasciitis chronic in nature.  Plan: Injected his bilateral heels with 10 mg Kenalog 5 mg Marcaine for maximal tenderness bilaterally.  Follow-up with him in 3 months

## 2019-01-01 ENCOUNTER — Ambulatory Visit (INDEPENDENT_AMBULATORY_CARE_PROVIDER_SITE_OTHER): Payer: Medicare Other | Admitting: Internal Medicine

## 2019-01-01 ENCOUNTER — Encounter: Payer: Self-pay | Admitting: Internal Medicine

## 2019-01-01 VITALS — BP 120/84 | HR 91 | Ht 66.0 in | Wt 208.5 lb

## 2019-01-01 DIAGNOSIS — M79604 Pain in right leg: Secondary | ICD-10-CM

## 2019-01-01 DIAGNOSIS — I25118 Atherosclerotic heart disease of native coronary artery with other forms of angina pectoris: Secondary | ICD-10-CM

## 2019-01-01 DIAGNOSIS — I10 Essential (primary) hypertension: Secondary | ICD-10-CM | POA: Diagnosis not present

## 2019-01-01 DIAGNOSIS — M79605 Pain in left leg: Secondary | ICD-10-CM

## 2019-01-01 DIAGNOSIS — E785 Hyperlipidemia, unspecified: Secondary | ICD-10-CM

## 2019-01-01 NOTE — Patient Instructions (Signed)
Medication Instructions:  Your physician recommends that you continue on your current medications as directed. Please refer to the Current Medication list given to you today.  If you need a refill on your cardiac medications before your next appointment, please call your pharmacy.   Lab work: - None ordered.  If you have labs (blood work) drawn today and your tests are completely normal, you will receive your results only by: Marland Kitchen MyChart Message (if you have MyChart) OR . A paper copy in the mail If you have any lab test that is abnormal or we need to change your treatment, we will call you to review the results.  Testing/Procedures: Your physician has requested that you have an ankle brachial index (ABI) in 4 MONTHS ON SAME DAY AS APPOINTMENT BUT BEFORE SEEING PROVIDER. During this test an ultrasound and blood pressure cuff are used to evaluate the arteries that supply the arms and legs with blood. Allow thirty minutes for this exam. There are no restrictions or special instructions.   Follow-Up: At [facility], you and your health needs are our priority.  As part of our continuing mission to provide you with exceptional heart care, we have created designated Provider Care Teams.  These Care Teams include your primary Cardiologist (physician) and Advanced Practice Providers (APPs -  Physician Assistants and Nurse Practitioners) who all work together to provide you with the care you need, when you need it. You will need a follow up appointment in 4 months with the ABI's on same day prior to appointment.    Please call our office 2 months (Call in October if not able to schedule today) in advance to schedule this appointment.  You may see Nelva Bush, MD or one of the following Advanced Practice Providers on your designated Care Team:   Murray Hodgkins, NP Christell Faith, PA-C . Marrianne Mood, PA-C     Ankle-Brachial Index Test Why am I having this test? The ankle-brachial index  (ABI) test is used to diagnose peripheral vascular disease (PVD). PVD is also known as peripheral arterial disease (PAD). PVD is the blocking or hardening of the arteries anywhere within the circulatory system beyond the heart. PVD is caused by:  Cholesterol deposits in your blood vessels (atherosclerosis). This is the most common cause of this condition.  Irritation and swelling (inflammation) in the blood vessels.  Blood clots in the vessels. Cholesterol deposits cause arteries to narrow. Normal delivery of oxygen to your tissues is affected, causing muscle pain and fatigue. This is called claudication. PVD means that there may also be a buildup of cholesterol:  In your heart. This increases the risk of heart attacks.  In your brain. This increases the risk of strokes. What is being tested? The ankle-brachial index test measures the blood flow in your arms and legs. The blood flow will show if blood vessels in your legs have been narrowed by cholesterol deposits. How do I prepare for this test?  Wear loose clothing.  Do not use any tobacco products, including cigarettes, chewing tobacco, or e-cigarettes, for at least 30 minutes before the test. What happens during the test?  1. You will lie down in a resting position. 2. Your health care provider will use a blood pressure machine and a small ultrasound device (Doppler) to measure the systolic pressures on your upper arms and ankles. Systolic pressure is the pressure inside your arteries when your heart pumps. 3. Systolic pressure measurements will be taken several times, and at several points,  on both the ankle and the arm. 4. Your health care provider will divide the highest systolic pressure of the ankle by the highest systolic pressure of the arm. The result is the ankle-brachial pressure ratio, or ABI. Sometimes this test will be repeated after you have exercised on a treadmill for 5 minutes. You may have leg pain during the exercise  portion of the test if you suffer from PVD. If the index number drops after exercise, this may show that PVD is present. How are the results reported? Your test results will be reported as a value that shows the ratio of your ankle pressure to your arm pressure (ABI ratio). Your health care provider will compare your results to normal ranges that were established after testing a large group of people (reference ranges). Reference ranges may vary among labs and hospitals. For this test, a common reference range is:  ABI ratio of 0.9 to 1.3. What do the results mean? An ABI ratio that is below the reference range is considered abnormal and may indicate PVD in the legs. Talk with your health care provider about what your results mean. Questions to ask your health care provider Ask your health care provider, or the department that is doing the test:  When will my results be ready?  How will I get my results?  What are my treatment options?  What other tests do I need?  What are my next steps? Summary  The ankle-brachial index (ABI) test is used to diagnose peripheral vascular disease (PVD). PVD is also known as peripheral arterial disease (PAD).  The ankle-brachial index test measures the blood flow in your arms and legs.  The highest systolic pressure of the ankle is divided by the highest systolic pressure of the arm. The result is the ABI ratio.  An ABI ratio that is below 0.9 is considered abnormal and may indicate PVD in the legs. This information is not intended to replace advice given to you by your health care provider. Make sure you discuss any questions you have with your health care provider. Document Released: 05/11/2004 Document Revised: 02/27/2018 Document Reviewed: 01/29/2017 Elsevier Patient Education  2020 [employer].

## 2019-01-01 NOTE — Progress Notes (Signed)
Follow-up Outpatient Visit Date: 01/01/2019  Primary Care Provider: Maryland Pink, MD [place] [address]  Chief Complaint: Follow-up coronary artery disease and leg pain  HPI:  Mr. Mak is a 68 y.o. year-old male with history of coronary artery disease status post NSTEMI (06/2018), hypertension, hyperlipidemia, hypothyroidism, plantar fasciitis, and peripheral neuropathy, who presents for follow-up of coronary artery disease.  He was last seen in our office in early March by Ignacia Bayley, NP.  That time, he was doing reasonably well though activity was limited by peripheral neuropathy and plantar fasciitis.  He also reported mild right shoulder discomfort at times with exertion less severe than what he experienced at the time of his NSTEMI in February, which was managed medically.  He also complained of some left leg pain due to elevated resting heart rates, amlodipine was decreased and carvedilol increased.  ABIs were also obtained (though never obtained due to COVID-19).  Today, Mr. Chizek reports that he feels about the same as at prior visits.  He still notices occasional arm discomfort, which is not related to any particular activity and is overall stable in frequency and intensity.  He makes note of this, because arm discomfort with this anginal equivalent at the time of NSTEMI in February.  His mobility is predominantly limited by foot pain attributed to plantar fasciitis.  He notes some pain in his distal calves, usually at night, that he describes as an aching in the bone.  It sometimes improves with application of heat.  He does not have any pain in his calves with ambulation.  He notes, however, that he was told that he had vascular problems about 10 years ago.  No intervention has been performed.  Mr. Hageman denies shortness of breath, palpitations, lightheadedness, and edema.  He is tolerating his current medications well other than bruising that has  worsened since being on dual antiplatelet therapy with aspirin and clopidogrel.  He happily notes that his cholesterol was much improved on recent check with Dr. Kary Kos.  --------------------------------------------------------------------------------------------------  Past Medical History:  Diagnosis Date   Aortic root dilatation ([place])    a. 06/2018 Echo: Mild dil of Ao root - asc Ao 82mm.   BPH (benign prostatic hyperplasia)    CAD (coronary artery disease)    a. 06/2018 NSTEMI/Cath: LM nl, LAD 60ost/m, D1 100 CTO, D2 40, LCX 60p/m, 15m, RCA small, 44m. Med Rx.   Diabetes mellitus without complication ([place])    Diverticulosis    Dysuria    History of kidney stones    HLD (hyperlipidemia)    Hx of gout    Hydronephrosis    Hypertension    Hypothyroidism    Hypothyroidism    Insomnia    Ischemic cardiomyopathy    a. 06/2018 Echo: EF 55-60%, sev inf HK.   Marginal ulcers    Neuromuscular disorder ([facility])    Nocturia    Over weight    Prostatitis    Testicular hypofunction    Ulcer    Urinary frequency    Past Surgical History:  Procedure Laterality Date   APPENDECTOMY     HEMORRHOID SURGERY     LEFT HEART CATH AND CORONARY ANGIOGRAPHY N/A 07/09/2018   Procedure: LEFT HEART CATH AND CORONARY ANGIOGRAPHY;  Surgeon: Nelva Bush, MD;  Location: [facility];  Service: Cardiovascular;  Laterality: N/A;   LITHOTRIPSY     x 3   NASAL SINUS SURGERY     STOMACH  SURGERY     VASECTOMY      Recent CV Pertinent Labs: Lab Results  Component Value Date   CHOL 234 (H) 07/07/2018   HDL 46 07/07/2018   LDLCALC 162 (H) 07/07/2018   TRIG 131 07/07/2018   CHOLHDL 5.1 07/07/2018   INR 1.01 07/07/2018   K 4.6 07/15/2018   K 4.3 06/04/2012   BUN 22 07/15/2018   BUN 28 (H) 06/04/2012   CREATININE 1.28 (H) 07/15/2018   CREATININE 1.84 (H) 06/04/2012    Past medical and surgical history were reviewed and updated in EPIC.  Current Meds    Medication Sig   allopurinol (ZYLOPRIM) 300 MG tablet Take 1 tablet by mouth daily.   amLODipine (NORVASC) 5 MG tablet Take 1 tablet (5 mg total) by mouth daily.   aspirin EC 81 MG EC tablet Take 1 tablet (81 mg total) by mouth daily.   atorvastatin (LIPITOR) 40 MG tablet Take 1 tablet (40 mg total) by mouth daily at 6 PM.   carvedilol (COREG) 6.25 MG tablet Take 1 tablet (6.25 mg total) by mouth 2 (two) times daily with a meal.   cetirizine (ZYRTEC) 5 MG tablet Take 10 mg by mouth daily as needed.    clopidogrel (PLAVIX) 75 MG tablet Take 1 tablet (75 mg total) by mouth daily with breakfast.   docusate sodium (COLACE) 100 MG capsule Take 100 mg by mouth daily as needed for mild constipation or moderate constipation.    fenofibrate micronized (LOFIBRA) 134 MG capsule Take 1 capsule by mouth at bedtime.   finasteride (PROSCAR) 5 MG tablet Take 1 tablet (5 mg total) by mouth daily.   fluticasone (FLONASE) 50 MCG/ACT nasal spray Place 2 sprays into both nostrils daily as needed for allergies or rhinitis.    gabapentin (NEURONTIN) 600 MG tablet Take 600 mg by mouth 3 (three) times daily.    levothyroxine (SYNTHROID, LEVOTHROID) 125 MCG tablet Take 125 mcg by mouth daily.    loratadine-pseudoephedrine (CLARITIN-D 24 HOUR) 10-240 MG 24 hr tablet Take 1 tablet by mouth daily as needed for allergies.    Melatonin 5 MG TABS Take 1 tablet by mouth daily.   nitroGLYCERIN (NITROSTAT) 0.4 MG SL tablet Place 1 tablet (0.4 mg total) under the tongue every 5 (five) minutes as needed for chest pain.   nortriptyline (PAMELOR) 25 MG capsule Take 25 mg by mouth at bedtime.    omega-3 acid ethyl esters (LOVAZA) 1 g capsule Take 1 capsule by mouth 2 (two) times daily.   Oxycodone HCl 10 MG TABS Take 10 tablets by mouth See admin instructions. Take 1 tablet (10mg ) by mouth five to six times daily as needed for pain   pantoprazole (PROTONIX) 40 MG tablet Take 40 mg by mouth daily.    senna-docusate (SENOKOT-S) 8.6-50 MG tablet Take 1 tablet by mouth daily as needed for mild constipation.    tamsulosin (FLOMAX) 0.4 MG CAPS capsule TAKE 1 CAPSULE BY MOUTH ONCE A DAY (Patient taking differently: Take 0.4 mg by mouth daily. )   zolpidem (AMBIEN) 10 MG tablet Take 10 mg by mouth at bedtime as needed for sleep.     Allergies: Benazepril, Amitriptyline, Duloxetine hcl, and Pollen extract  Social History   Tobacco Use   Smoking status: Never Smoker   Smokeless tobacco: Never Used  Substance Use Topics   Alcohol use: No   Drug use: No    Family History  Problem Relation Age of Onset   Kidney disease Mother  Diabetes type II Mother    Heart disease Mother    Prostate cancer Neg Hx     Review of Systems: A 12-system review of systems was performed and was negative except as noted in the HPI.  --------------------------------------------------------------------------------------------------  Physical Exam: BP 120/84 (BP Location: Left Arm, Patient Position: Sitting, Cuff Size: Normal)    Pulse 91    Ht 5\' 6"  (1.676 m)    Wt 208 lb 8 oz (94.6 kg)    BMI 33.65 kg/m   General: NAD. HEENT: No conjunctival pallor or scleral icterus.  Facemask in place Neck: Supple without lymphadenopathy, thyromegaly, JVD, or HJR. Lungs: Normal work of breathing. Clear to auscultation bilaterally without wheezes or crackles. Heart: Regular rate and rhythm without murmurs, rubs, or gallops. Non-displaced PMI. Abd: Bowel sounds present. Soft, NT/ND without hepatosplenomegaly Ext: No lower extremity edema.  Radial pulses are 1+ bilaterally.  Posterior tibial and dorsalis pedis pulses are trace to 1+ bilaterally. Skin: Warm and dry without rash.  EKG: Normal sinus rhythm with rightward axis.  Otherwise, no significant abnormality.  Lab Results  Component Value Date   WBC 5.2 07/09/2018   HGB 14.5 07/09/2018   HCT 44.8 07/09/2018   MCV 88.9 07/09/2018   PLT 205 07/09/2018     Lab Results  Component Value Date   NA 138 07/15/2018   K 4.6 07/15/2018   CL 97 07/15/2018   CO2 22 07/15/2018   BUN 22 07/15/2018   CREATININE 1.28 (H) 07/15/2018   GLUCOSE 166 (H) 07/15/2018    Lab Results  Component Value Date   CHOL 234 (H) 07/07/2018   HDL 46 07/07/2018   LDLCALC 162 (H) 07/07/2018   TRIG 131 07/07/2018   CHOLHDL 5.1 07/07/2018    --------------------------------------------------------------------------------------------------  ASSESSMENT AND PLAN: Coronary artery disease with stable angina: Mr. Staup has not had any chest pain but continues to have intermittent arm pain, which has been his anginal equivalent in the past.  Is not exertional and unclear if it is truly due to coronary insufficiency.  We discussed escalation of antianginal therapy but have agreed to continue with his current regimen of amlodipine and carvedilol.  We also continue with secondary prevention, including 12 months of dual antiplatelet therapy with aspirin and clopidogrel from the time of his MI in February.  Leg pain: Likely multifactorial, including plantar fasciitis and some musculoskeletal component.  Pedal pulses are noted to be diminished on exam, and with multiple cardiovascular risk factors including CAD, PAD is also a consideration.  We will plan to move forward with ABIs that were previously ordered, with Mr. Amory returns for his next follow-up visit.  I advised him to contact us if his symptoms were to worsen.  Hyperlipidemia: LDL well controlled on last check with Dr. Kary Kos (77 on 11/26/2018).  Triglycerides still somewhat elevated at 247 but much improved.  LFTs remained normal.  We will plan to continue fish oil, fenofibrate, and atorvastatin.  Hypertension: Blood pressure well controlled today.  No medication changes at this time.  Follow-up: Return to clinic in 4 months.  Nelva Bush, MD 01/01/2019 3:15 PM

## 2019-04-01 ENCOUNTER — Ambulatory Visit: Payer: Medicare Other | Admitting: Podiatry

## 2019-04-13 ENCOUNTER — Ambulatory Visit (INDEPENDENT_AMBULATORY_CARE_PROVIDER_SITE_OTHER): Payer: Medicare Other | Admitting: Podiatry

## 2019-04-13 ENCOUNTER — Encounter: Payer: Self-pay | Admitting: Podiatry

## 2019-04-13 ENCOUNTER — Other Ambulatory Visit: Payer: Self-pay

## 2019-04-13 DIAGNOSIS — M722 Plantar fascial fibromatosis: Secondary | ICD-10-CM | POA: Diagnosis not present

## 2019-04-13 NOTE — Progress Notes (Signed)
He presents today for follow-up of his plantar fasciitis.  Objective: Pulses are palpable.  He has severe pain on palpation medial calcaneal tubercles.  Assessment: Plantar fasciitis.  Plan: I injected the area today with 10 mg Kenalog 5 mg Marcaine point of maximal tenderness.  Tolerated procedure well follow-up with him in a month or so.

## 2019-04-24 ENCOUNTER — Telehealth (INDEPENDENT_AMBULATORY_CARE_PROVIDER_SITE_OTHER): Payer: Medicare Other | Admitting: Internal Medicine

## 2019-04-24 ENCOUNTER — Other Ambulatory Visit: Payer: Self-pay

## 2019-04-24 ENCOUNTER — Encounter: Payer: Self-pay | Admitting: Internal Medicine

## 2019-04-24 ENCOUNTER — Telehealth: Payer: Self-pay

## 2019-04-24 ENCOUNTER — Ambulatory Visit (INDEPENDENT_AMBULATORY_CARE_PROVIDER_SITE_OTHER): Payer: Medicare Other

## 2019-04-24 VITALS — BP 146/90 | Ht 66.0 in | Wt 202.0 lb

## 2019-04-24 DIAGNOSIS — I10 Essential (primary) hypertension: Secondary | ICD-10-CM | POA: Diagnosis not present

## 2019-04-24 DIAGNOSIS — I25118 Atherosclerotic heart disease of native coronary artery with other forms of angina pectoris: Secondary | ICD-10-CM | POA: Diagnosis not present

## 2019-04-24 DIAGNOSIS — E785 Hyperlipidemia, unspecified: Secondary | ICD-10-CM

## 2019-04-24 DIAGNOSIS — M79605 Pain in left leg: Secondary | ICD-10-CM

## 2019-04-24 DIAGNOSIS — M79604 Pain in right leg: Secondary | ICD-10-CM

## 2019-04-24 MED ORDER — AMLODIPINE BESYLATE 10 MG PO TABS: 10.0000 mg | ORAL_TABLET | Freq: Every day | ORAL | 1 refills | Status: DC

## 2019-04-24 NOTE — Progress Notes (Signed)
Called patient and he verbalized understanding of the instructions as listed on AVS.  3 month f/u scheduled.

## 2019-04-24 NOTE — Progress Notes (Signed)
Virtual Visit via Telephone Note   This visit type was conducted due to national recommendations for restrictions regarding the COVID-19 Pandemic (e.g. social distancing) in an effort to limit this patient's exposure and mitigate transmission in our community.  Due to his co-morbid illnesses, this patient is at least at moderate risk for complications without adequate follow up.  This format is felt to be most appropriate for this patient at this time.  The patient did not have access to video technology/had technical difficulties with video requiring transitioning to audio format only (telephone).  All issues noted in this document were discussed and addressed.  No physical exam could be performed with this format.  Please refer to the patient's chart for his  consent to telehealth for [facility].   Date:  04/24/2019   ID:  Troy Rojas, DOB 06-11-1950, MRN [id number]  Patient Location: Home Provider Location: Home  PCP:  Maryland Pink, MD  Cardiologist:  Nelva Bush, MD  Electrophysiologist:  None   Evaluation Performed:  Follow-Up Visit  Chief Complaint:  Leg pain  History of Present Illness:    Troy Rojas is a 68 y.o. male with history of coronary artery disease status post NSTEMI (06/2018), hypertension, hyperlipidemia, hypothyroidism, plantar fasciitis, and peripheral neuropathy.  I last saw him in mid August, at which time he reported feeling about the same as at our prior visits.  He continued to describe occasional arm discomfort unrelated to any particular activity.  This was notable because arm discomfort was his anginal equivalent at the time of NSTEMI in February.  His mobility was limited at our last visit secondary to plantar fasciitis.  There is concerned that some of his leg pain could also represent PAD.  ABIs were ordered but never performed.  We did not make any medication changes at our last visit.  Today, Mr. Boose continues to experience frequent leg  pain, most often at night.  It seems to affect the left shin the most, feeling like there is a deep ache in the bone.  He denies trauma and does not have much pain when walking (though he continues to require regular steroid shots for treatment of his plantar fasciitis.  He reports having severe bilateral shin pain reminiscent of what he feels now on the left when he was a kindergarten.  The pain was attributed to "growing pains" and resolved spontaneously.  Mr. Bas denies chest pain.  He has migratory forearm pain that is not exertional or related to particular activities.  He denies recent lightheadedness but also notes that his blood pressure has been trending up since amlodipine was decreased in March.  He denies shortness of breath, palpitations, and edema.  The patient does not have symptoms concerning for COVID-19 infection (fever, chills, cough, or new shortness of breath).    Past Medical History:  Diagnosis Date  . Aortic root dilatation ([place])    a. 06/2018 Echo: Mild dil of Ao root - asc Ao 94mm.  Marland Kitchen BPH (benign prostatic hyperplasia)   . CAD (coronary artery disease)    a. 06/2018 NSTEMI/Cath: LM nl, LAD 60ost/m, D1 100 CTO, D2 40, LCX 60p/m, 92m, RCA small, 2m. Med Rx.  . Diabetes mellitus without complication ([place])   . Diverticulosis   . Dysuria   . History of kidney stones   . HLD (hyperlipidemia)   . Hx of gout   . Hydronephrosis   . Hypertension   . Hypothyroidism   . Hypothyroidism   .  Insomnia   . Ischemic cardiomyopathy    a. 06/2018 Echo: EF 55-60%, sev inf HK.  . Marginal ulcers   . Neuromuscular disorder ([place])   . Nocturia   . Over weight   . Prostatitis   . Testicular hypofunction   . Ulcer   . Urinary frequency    Past Surgical History:  Procedure Laterality Date  . APPENDECTOMY    . HEMORRHOID SURGERY    . LEFT HEART CATH AND CORONARY ANGIOGRAPHY N/A 07/09/2018   Procedure: LEFT HEART CATH AND CORONARY ANGIOGRAPHY;  Surgeon: Nelva Bush, MD;   Location: [place] [facility];  Service: Cardiovascular;  Laterality: N/A;  . LITHOTRIPSY     x 3  . NASAL SINUS SURGERY    . STOMACH SURGERY    . VASECTOMY       Current Meds  Medication Sig  . allopurinol (ZYLOPRIM) 300 MG tablet Take 1 tablet by mouth daily.  Marland Kitchen aspirin EC 81 MG EC tablet Take 1 tablet (81 mg total) by mouth daily.  Marland Kitchen atorvastatin (LIPITOR) 40 MG tablet Take 1 tablet (40 mg total) by mouth daily at 6 PM.  . carvedilol (COREG) 6.25 MG tablet Take 1 tablet (6.25 mg total) by mouth 2 (two) times daily with a meal.  . cetirizine (ZYRTEC) 5 MG tablet Take 10 mg by mouth daily as needed.   . clopidogrel (PLAVIX) 75 MG tablet Take 1 tablet (75 mg total) by mouth daily with breakfast.  . docusate sodium (COLACE) 100 MG capsule Take 100 mg by mouth daily as needed for mild constipation or moderate constipation.   . fenofibrate micronized (LOFIBRA) 134 MG capsule Take 1 capsule by mouth at bedtime.  . finasteride (PROSCAR) 5 MG tablet Take 1 tablet (5 mg total) by mouth daily.  . fluticasone (FLONASE) 50 MCG/ACT nasal spray Place 2 sprays into both nostrils daily as needed for allergies or rhinitis.   Marland Kitchen gabapentin (NEURONTIN) 600 MG tablet Take 600 mg by mouth 3 (three) times daily.   . Lancets (ONETOUCH DELICA PLUS 123XX123) MISC USE 3 TIMES DAILY AS INSTRUCTED  . levothyroxine (SYNTHROID, LEVOTHROID) 125 MCG tablet Take 125 mcg by mouth daily.   Marland Kitchen loratadine-pseudoephedrine (CLARITIN-D 24 HOUR) 10-240 MG 24 hr tablet Take 1 tablet by mouth daily as needed for allergies.   . Melatonin 5 MG TABS Take 1 tablet by mouth daily.  . metFORMIN (GLUCOPHAGE-XR) 500 MG 24 hr tablet Take 1,000 mg by mouth at bedtime.  . nitroGLYCERIN (NITROSTAT) 0.4 MG SL tablet Place 1 tablet (0.4 mg total) under the tongue every 5 (five) minutes as needed for chest pain.  . nortriptyline (PAMELOR) 25 MG capsule Take 25 mg by mouth at bedtime.   Marland Kitchen omega-3 acid ethyl esters (LOVAZA) 1 g capsule Take 1  capsule by mouth 2 (two) times daily.  Glory Rosebush ULTRA test strip daily.  . Oxycodone HCl 10 MG TABS Take 10 tablets by mouth See admin instructions. Take 1 tablet (10mg ) by mouth five to six times daily as needed for pain  . pantoprazole (PROTONIX) 40 MG tablet Take 40 mg by mouth daily.  Marland Kitchen senna-docusate (SENOKOT-S) 8.6-50 MG tablet Take 1 tablet by mouth daily as needed for mild constipation.   . tamsulosin (FLOMAX) 0.4 MG CAPS capsule TAKE 1 CAPSULE BY MOUTH ONCE A DAY (Patient taking differently: Take 0.4 mg by mouth daily. )  . zolpidem (AMBIEN) 10 MG tablet Take 10 mg by mouth at bedtime as needed for sleep.   . [  DISCONTINUED] amLODipine (NORVASC) 5 MG tablet Take 1 tablet (5 mg total) by mouth daily.     Allergies:   Benazepril, Amitriptyline, Duloxetine hcl, and Pollen extract   Social History   Tobacco Use  . Smoking status: Never Smoker  . Smokeless tobacco: Never Used  Substance Use Topics  . Alcohol use: No  . Drug use: No     Family Hx: The patient's family history includes Diabetes type II in his mother; Heart disease in his mother; Kidney disease in his mother. There is no history of Prostate cancer.  ROS:   Please see the history of present illness.   All other systems reviewed and are negative.   Prior CV studies:   The following studies were reviewed today:  LHC (07/09/2018): 1. Multivessel coronary artery disease, as detailed below.  Culprit lesion for the patient's NSTEMI is most likely the RCA, which is small (<2 mm in diameter) and not a suitable target for PCI.  Occlusion of the D1 appears chronic with left-to-left collaterals. 2. Moderate CAD involving proximal/mid LAD and mid LCx with significant calcification. 3. Moderate to severe AV groove LCx with 70%-80% stenosis after takeoff of large OM2 branch. 4. Mildly elevated left ventricular filling pressure.  TTE (07/08/2018): Normal LV size with mild LVH.  LVEF 55-60%.  Inferior hypokinesis noted.   Degenerative mitral valve changes.  Borderline dilation of the aortic root, measuring 3.7 cm  Labs/Other Tests and Data Reviewed:    EKG:  No ECG reviewed.  Recent Labs: 07/09/2018: Hemoglobin 14.5; Platelets 205 07/15/2018: BUN 22; Creatinine, Ser 1.28; Potassium 4.6; Sodium 138   Recent Lipid Panel Lab Results  Component Value Date/Time   CHOL 234 (H) 07/07/2018 09:04 PM   TRIG 131 07/07/2018 09:04 PM   HDL 46 07/07/2018 09:04 PM   CHOLHDL 5.1 07/07/2018 09:04 PM   LDLCALC 162 (H) 07/07/2018 09:04 PM    Wt Readings from Last 3 Encounters:  04/24/19 202 lb (91.6 kg)  01/01/19 208 lb 8 oz (94.6 kg)  07/29/18 208 lb (94.3 kg)     Objective:    Vital Signs:  BP (!) 146/90 (BP Location: Left Arm) Comment: BP not function properly  Ht 5\' 6"  (1.676 m)   Wt 202 lb (91.6 kg)   BMI 32.60 kg/m    VITAL SIGNS:  reviewed  ASSESSMENT & PLAN:    CAD: No frank angina reported, though his anginal equivalent was felt to be right forearm pain.  He continues to have intermittent arm pain, though it migrates from side to side and is not exertional.  It should be noted that left arm BP was lower than on the right during today's ABI exam.  Given arm claudication and dizziness, we will defer testing at this time.  However, we will need to readdress this at our next visit, including utility of obtaining a carotid Doppler or referring Mr. Vanderford to vascular surgery.  Continue current dose of carvedilol, as well as 12 months of DAPT.  Hypertension: Blood pressure has been trending up and is mildly elevated today.  We will increase amlodipine to 10 mg daily.  Hyperlipidemia: LDL at goal (57 in 11/2018).  Continue atorvastatin 40 mg daily.  Leg pain: No clear evidence that nocturnal calf/shin pain (left > right) is related to PAD, given normal ABI's.  We discussed the utility of tib-fib radiographs but have agreed to defer further workup to Drs. Hedrick and [place].  COVID-19 Education: The signs  and symptoms of COVID-19  were discussed with the patient and how to seek care for testing (follow up with PCP or arrange E-visit).  The importance of social distancing was discussed today.  Time:   Today, I have spent 15 minutes with the patient with telehealth technology discussing the above problems.     Medication Adjustments/Labs and Tests Ordered: Current medicines are reviewed at length with the patient today.  Concerns regarding medicines are outlined above.   Tests Ordered: No orders of the defined types were placed in this encounter.   Medication Changes: Meds ordered this encounter  Medications  . amLODipine (NORVASC) 10 MG tablet    Sig: Take 1 tablet (10 mg total) by mouth daily.    Dispense:  90 tablet    Refill:  1    Follow Up:  In Person in 3 month(s)  Signed, Nelva Bush, MD  04/24/2019 8:57 PM    [place]

## 2019-04-24 NOTE — Telephone Encounter (Signed)
-----   Message from Theora Gianotti, NP sent at 04/24/2019  1:56 PM EST ----- Normal ABI's.

## 2019-04-24 NOTE — Patient Instructions (Addendum)
Other Instructions  Please let us know if you have worsening arm pain or return of lightheadedness.  Medication Instructions:  Your physician has recommended you make the following change in your medication:  1- INCREASE Amlodipine to 10 mg by mouth once a day.  *If you need a refill on your cardiac medications before your next appointment, please call your pharmacy*  Lab Work: none If you have labs (blood work) drawn today and your tests are completely normal, you will receive your results only by: Marland Kitchen MyChart Message (if you have MyChart) OR . A paper copy in the mail If you have any lab test that is abnormal or we need to change your treatment, we will call you to review the results.  Testing/Procedures: none  Follow-Up: At [facility], you and your health needs are our priority.  As part of our continuing mission to provide you with exceptional heart care, we have created designated Provider Care Teams.  These Care Teams include your primary Cardiologist (physician) and Advanced Practice Providers (APPs -  Physician Assistants and Nurse Practitioners) who all work together to provide you with the care you need, when you need it.  Your next appointment:   3 month(s)  The format for your next appointment:   In Person  Provider:    You may see Nelva Bush, MD or one of the following Advanced Practice Providers on your designated Care Team:    Murray Hodgkins, NP  Christell Faith, PA-C  Marrianne Mood, PA-C

## 2019-04-24 NOTE — Telephone Encounter (Signed)
Call to patient to make him aware of results. Per DPR okay to leave detailed message. No further orders at this time.   Advised pt to call for any further questions or concerns.

## 2019-07-08 ENCOUNTER — Ambulatory Visit: Payer: Medicare Other | Admitting: Podiatry

## 2019-07-24 ENCOUNTER — Ambulatory Visit: Payer: Medicare Other | Admitting: Internal Medicine

## 2019-07-28 NOTE — Progress Notes (Signed)
Follow-up Outpatient Visit Date: 07/29/2019  Primary Care Provider: Maryland Pink, MD [place] [address]  Chief Complaint: Palpitations  HPI:  [name] is a 69 y.o. male with history of coronary artery disease status postNSTEMI (06/2018), hypertension, hyperlipidemia, hypothyroidism, plantar fasciitis, and peripheral neuropathy, who presents for follow-up of leg pain and coronary artery disease.  I last spoke with him via virtual visit in early December, at which time he was most concerned about leg pain.  He denied chest pain.  ABIs in 04/2019 were normal.  Today, Troy Rojas reports that he has been doing well other than 2 episodes of palpitations in January.  On 1/14, he felt as though his heart was racing and fluttering most of the day.  When he woke up the next morning, things had resolved.  However, the day after, he had another 2-hour stretch.  He denies associated symptoms including chest pain, shortness of breath, and lightheadedness.  He does not know of any trigger.  He saw Dr. Kary Kos, who ordered a 1 week event monitor.  Troy Rojas has not heard the results yet.  Chronic foot pain related to plantar fasciitis is unchanged from prior visits.  --------------------------------------------------------------------------------------------------  Past Medical History:  Diagnosis Date  . Aortic root dilatation ([place])    a. 06/2018 Echo: Mild dil of Ao root - asc Ao 28mm.  Marland Kitchen BPH (benign prostatic hyperplasia)   . CAD (coronary artery disease)    a. 06/2018 NSTEMI/Cath: LM nl, LAD 60ost/m, D1 100 CTO, D2 40, LCX 60p/m, 4m, RCA small, 48m. Med Rx.  . Diabetes mellitus without complication ([place])   . Diverticulosis   . Dysuria   . History of kidney stones   . HLD (hyperlipidemia)   . Hx of gout   . Hydronephrosis   . Hypertension   . Hypothyroidism   . Hypothyroidism   . Insomnia   . Ischemic cardiomyopathy    a. 06/2018 Echo: EF 55-60%, sev  inf HK.  . Marginal ulcers   . Neuromuscular disorder ([place])   . Nocturia   . Over weight   . Prostatitis   . Testicular hypofunction   . Ulcer   . Urinary frequency    Past Surgical History:  Procedure Laterality Date  . APPENDECTOMY    . HEMORRHOID SURGERY    . LEFT HEART CATH AND CORONARY ANGIOGRAPHY N/A 07/09/2018   Procedure: LEFT HEART CATH AND CORONARY ANGIOGRAPHY;  Surgeon: Nelva Bush, MD;  Location: [place] [facility];  Service: Cardiovascular;  Laterality: N/A;  . LITHOTRIPSY     x 3  . NASAL SINUS SURGERY    . STOMACH SURGERY    . VASECTOMY      Current Meds  Medication Sig  . allopurinol (ZYLOPRIM) 300 MG tablet Take 1 tablet by mouth daily.  Marland Kitchen amLODipine (NORVASC) 10 MG tablet Take 1 tablet (10 mg total) by mouth daily.  Marland Kitchen aspirin EC 81 MG EC tablet Take 1 tablet (81 mg total) by mouth daily.  Marland Kitchen atorvastatin (LIPITOR) 40 MG tablet Take 1 tablet (40 mg total) by mouth daily at 6 PM.  . carvedilol (COREG) 6.25 MG tablet Take 1 tablet (6.25 mg total) by mouth 2 (two) times daily with a meal.  . cetirizine (ZYRTEC) 5 MG tablet Take 10 mg by mouth daily as needed.   . clopidogrel (PLAVIX) 75 MG tablet Take 1 tablet (75 mg total) by mouth daily with breakfast.  . docusate sodium (  COLACE) 100 MG capsule Take 100 mg by mouth daily as needed for mild constipation or moderate constipation.   . fenofibrate micronized (LOFIBRA) 134 MG capsule Take 1 capsule by mouth at bedtime.  . finasteride (PROSCAR) 5 MG tablet Take 1 tablet (5 mg total) by mouth daily.  . fluticasone (FLONASE) 50 MCG/ACT nasal spray Place 2 sprays into both nostrils daily as needed for allergies or rhinitis.   Marland Kitchen gabapentin (NEURONTIN) 600 MG tablet Take 600 mg by mouth 3 (three) times daily.   . Lancets (ONETOUCH DELICA PLUS 123XX123) MISC USE 3 TIMES DAILY AS INSTRUCTED  . levothyroxine (SYNTHROID, LEVOTHROID) 125 MCG tablet Take 125 mcg by mouth daily.   Marland Kitchen loratadine-pseudoephedrine (CLARITIN-D 24  HOUR) 10-240 MG 24 hr tablet Take 1 tablet by mouth daily as needed for allergies.   . Melatonin 5 MG TABS Take 1 tablet by mouth daily.  . metFORMIN (GLUCOPHAGE-XR) 500 MG 24 hr tablet Take 1,000 mg by mouth at bedtime.  . nitroGLYCERIN (NITROSTAT) 0.4 MG SL tablet Place 1 tablet (0.4 mg total) under the tongue every 5 (five) minutes as needed for chest pain.  . nortriptyline (PAMELOR) 25 MG capsule Take 25 mg by mouth at bedtime.   Marland Kitchen omega-3 acid ethyl esters (LOVAZA) 1 g capsule Take 1 capsule by mouth 2 (two) times daily.  Glory Rosebush ULTRA test strip daily.  . Oxycodone HCl 10 MG TABS Take 10 tablets by mouth See admin instructions. Take 1 tablet (10mg ) by mouth five to six times daily as needed for pain  . pantoprazole (PROTONIX) 40 MG tablet Take 40 mg by mouth daily.  Marland Kitchen senna-docusate (SENOKOT-S) 8.6-50 MG tablet Take 1 tablet by mouth daily as needed for mild constipation.   . tamsulosin (FLOMAX) 0.4 MG CAPS capsule TAKE 1 CAPSULE BY MOUTH ONCE A DAY (Patient taking differently: Take 0.4 mg by mouth daily. )  . zolpidem (AMBIEN) 10 MG tablet Take 10 mg by mouth at bedtime as needed for sleep.     Allergies: Benazepril, Amitriptyline, Duloxetine hcl, and Pollen extract  Social History   Tobacco Use  . Smoking status: Never Smoker  . Smokeless tobacco: Never Used  Substance Use Topics  . Alcohol use: No  . Drug use: No    Family History  Problem Relation Age of Onset  . Kidney disease Mother   . Diabetes type II Mother   . Heart disease Mother   . Prostate cancer Neg Hx     Review of Systems: A 12-system review of systems was performed and was negative except as noted in the HPI.  --------------------------------------------------------------------------------------------------  Physical Exam: BP 120/82 (BP Location: Left Arm, Patient Position: Sitting, Cuff Size: Normal)   Pulse 74   Ht 5\' 6"  (1.676 m)   Wt 207 lb 8 oz (94.1 kg)   SpO2 98%   BMI 33.49 kg/m    General: NAD. Neck: No JVD or HJR. Lungs: Clear to auscultation bilaterally without wheezes or crackles. Heart: Regular rate and rhythm without murmurs, rubs, or gallops. Abdomen: Soft, nontender, and nondistended. Extremities: No lower extremity edema.  EKG: Normal sinus rhythm without abnormality.  Lab Results  Component Value Date   WBC 5.2 07/09/2018   HGB 14.5 07/09/2018   HCT 44.8 07/09/2018   MCV 88.9 07/09/2018   PLT 205 07/09/2018    Lab Results  Component Value Date   NA 138 07/15/2018   K 4.6 07/15/2018   CL 97 07/15/2018   CO2 22 07/15/2018  BUN 22 07/15/2018   CREATININE 1.28 (H) 07/15/2018   GLUCOSE 166 (H) 07/15/2018    Lab Results  Component Value Date   CHOL 234 (H) 07/07/2018   HDL 46 07/07/2018   LDLCALC 162 (H) 07/07/2018   TRIG 131 07/07/2018   CHOLHDL 5.1 07/07/2018    --------------------------------------------------------------------------------------------------  ASSESSMENT AND PLAN: Coronary artery disease: Troy Rojas remains asymptomatic despite having moderate to severe multivessel CAD.  We have agreed to continue with medical therapy.  We discussed the risks and benefits of long-term dual antiplatelet therapy and have agreed to continue aspirin and clopidogrel unless he were to have bleeding problems.  We will also continue with atorvastatin 40 mg daily.  LDL was well controlled on last check with Dr. Kary Kos in January (48).  Palpitations: Extended palpitations reported on two occasions in January.  Given the lack of associated symptoms, I suspect this was most likely supraventricular in origin.  We will reach out to Dr. Barbarann Ehlers office to request a copy of the event monitor.  In the meantime, we will continue carvedilol 6.25 mg twice daily.  Hypertension: Blood pressure reasonable today though but diastolic reading is borderline elevated.  We will continue current doses of amlodipine and carvedilol.  We will continue to avoid ACE  inhibitors and ARB's given history of possible angioedema with benazepril.  Hyperlipidemia: LDL at goal though triglycerides were still mildly elevated on last check.  We will continue with atorvastatin and fenofibrate.  Addition of Vascepa could be considered in the future if triglycerides remain above 150.  Follow-up: Return to clinic in 4 months, sooner if palpitations recur or other symptoms develop.  Nelva Bush, MD 07/29/2019 11:43 AM

## 2019-07-29 ENCOUNTER — Other Ambulatory Visit: Payer: Self-pay

## 2019-07-29 ENCOUNTER — Ambulatory Visit (INDEPENDENT_AMBULATORY_CARE_PROVIDER_SITE_OTHER): Payer: Medicare Other | Admitting: Internal Medicine

## 2019-07-29 ENCOUNTER — Encounter: Payer: Self-pay | Admitting: Internal Medicine

## 2019-07-29 VITALS — BP 120/82 | HR 74 | Ht 66.0 in | Wt 207.5 lb

## 2019-07-29 DIAGNOSIS — R002 Palpitations: Secondary | ICD-10-CM | POA: Insufficient documentation

## 2019-07-29 DIAGNOSIS — I25118 Atherosclerotic heart disease of native coronary artery with other forms of angina pectoris: Secondary | ICD-10-CM | POA: Insufficient documentation

## 2019-07-29 DIAGNOSIS — E782 Mixed hyperlipidemia: Secondary | ICD-10-CM

## 2019-07-29 DIAGNOSIS — I10 Essential (primary) hypertension: Secondary | ICD-10-CM | POA: Diagnosis not present

## 2019-07-29 DIAGNOSIS — I2510 Atherosclerotic heart disease of native coronary artery without angina pectoris: Secondary | ICD-10-CM | POA: Diagnosis not present

## 2019-07-29 MED ORDER — AMLODIPINE BESYLATE 10 MG PO TABS: 10.0000 mg | ORAL_TABLET | Freq: Every day | ORAL | 2 refills | Status: DC

## 2019-07-29 MED ORDER — CLOPIDOGREL BISULFATE 75 MG PO TABS: 75.0000 mg | ORAL_TABLET | Freq: Every day | ORAL | 2 refills | Status: DC

## 2019-07-29 MED ORDER — ATORVASTATIN CALCIUM 40 MG PO TABS: 40.0000 mg | ORAL_TABLET | Freq: Every day | ORAL | 2 refills | Status: DC

## 2019-07-29 MED ORDER — CARVEDILOL 6.25 MG PO TABS: 6.2500 mg | ORAL_TABLET | Freq: Two times a day (BID) | ORAL | 2 refills | Status: DC

## 2019-07-29 NOTE — Patient Instructions (Signed)
We will have you sign a request to fax to Dr Hedrick's office requesting your heart monitor results.   Medication Instructions:  Your physician recommends that you continue on your current medications as directed. Please refer to the Current Medication list given to you today.  *If you need a refill on your cardiac medications before your next appointment, please call your pharmacy*  Lab Work: none If you have labs (blood work) drawn today and your tests are completely normal, you will receive your results only by: Marland Kitchen MyChart Message (if you have MyChart) OR . A paper copy in the mail If you have any lab test that is abnormal or we need to change your treatment, we will call you to review the results.  Testing/Procedures: none  Follow-Up: At [facility], you and your health needs are our priority.  As part of our continuing mission to provide you with exceptional heart care, we have created designated Provider Care Teams.  These Care Teams include your primary Cardiologist (physician) and Advanced Practice Providers (APPs -  Physician Assistants and Nurse Practitioners) who all work together to provide you with the care you need, when you need it.  We recommend signing up for the patient portal called "MyChart".  Sign up information is provided on this After Visit Summary.  MyChart is used to connect with patients for Virtual Visits (Telemedicine).  Patients are able to view lab/test results, encounter notes, upcoming appointments, etc.  Non-urgent messages can be sent to your provider as well.   To learn more about what you can do with MyChart, go to [email or web address].    Your next appointment:   4 month(s)  The format for your next appointment:   In Person  Provider:    You may see Nelva Bush, MD or one of the following Advanced Practice Providers on your designated Care Team:    Murray Hodgkins, NP  Christell Faith, PA-C  Marrianne Mood, PA-C

## 2019-08-03 ENCOUNTER — Encounter: Payer: Self-pay | Admitting: Podiatry

## 2019-08-03 ENCOUNTER — Other Ambulatory Visit: Payer: Self-pay

## 2019-08-03 ENCOUNTER — Ambulatory Visit (INDEPENDENT_AMBULATORY_CARE_PROVIDER_SITE_OTHER): Payer: Medicare Other | Admitting: Podiatry

## 2019-08-03 VITALS — Temp 97.2°F

## 2019-08-03 DIAGNOSIS — I2510 Atherosclerotic heart disease of native coronary artery without angina pectoris: Secondary | ICD-10-CM

## 2019-08-03 DIAGNOSIS — M722 Plantar fascial fibromatosis: Secondary | ICD-10-CM

## 2019-08-03 NOTE — Progress Notes (Signed)
He presents today chief complaint of painful heels bilaterally.  He states that they have to be injected about every 3 to 4 months he says he is even a month late right now he is really paying the price point.  Objective: Vital signs are stable he is alert and oriented x3 chronic intractable neuritis plantar fasciitis bilateral foot.  Assessment: Chronic intractable plantar fasciitis neuritis.  Plan: At this point we will go ahead and reinject his heels.  I injected 20 mg Kenalog 5 mg Marcaine point maximal tenderness bilateral heels.

## 2019-10-05 IMAGING — CT CT ANGIO CHEST
4 of 7 series · 18 of 46 positions shown · IV contrast (APPLIED)
Comparison: None.

CLINICAL DATA: 67-year-old male with history of right-sided arm
pain since 1 p.m. today. No associated nausea or vomiting. Pain in
the right upper chest. No shortness of breath.

EXAM:
CT ANGIOGRAPHY CHEST WITH CONTRAST
TECHNIQUE: Multidetector CT imaging of the chest was performed using the
standard protocol during bolus administration of intravenous
contrast. Multiplanar CT image reconstructions and MIPs were
obtained to evaluate the vascular anatomy.
CONTRAST:  75mL OMNIPAQUE IOHEXOL 350 MG/ML SOLN

[Series 3: axial pre · axial · non-contrast · 0.72mm/px · z∈[-314,-119]mm · 5 of 59 slices shown]
[im 10/59  lung]
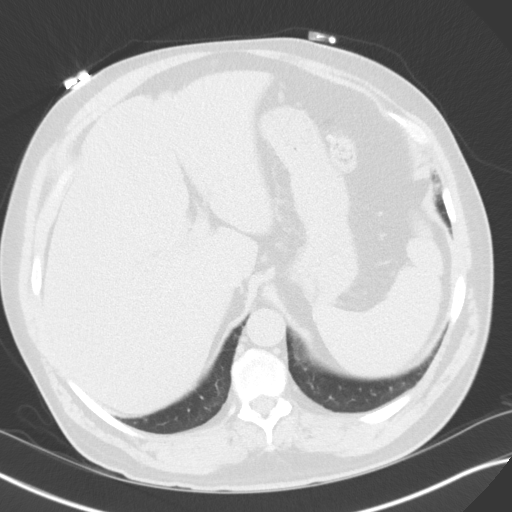
[im 20/59  lung]
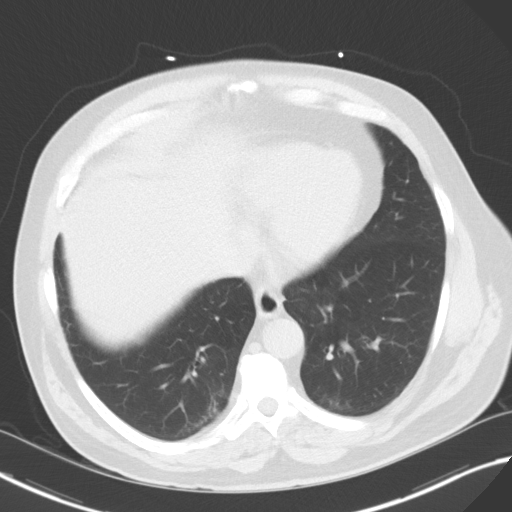
[im 30/59  lung]
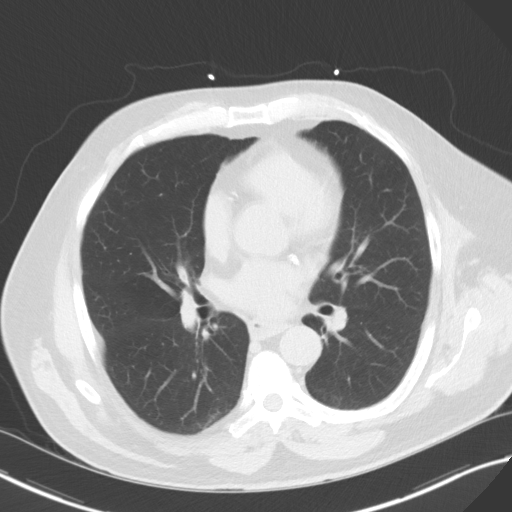
[im 39/59  lung]
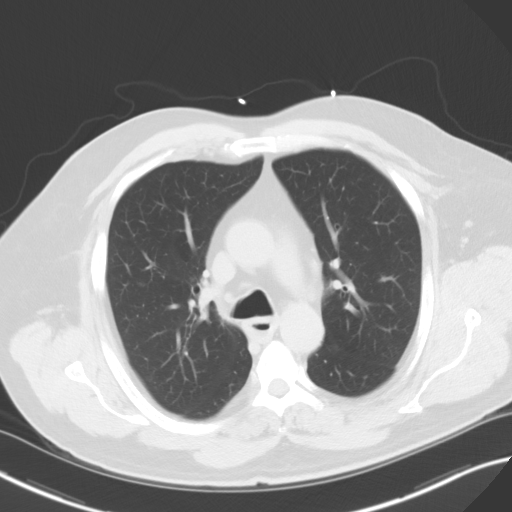
[im 49/59  lung]
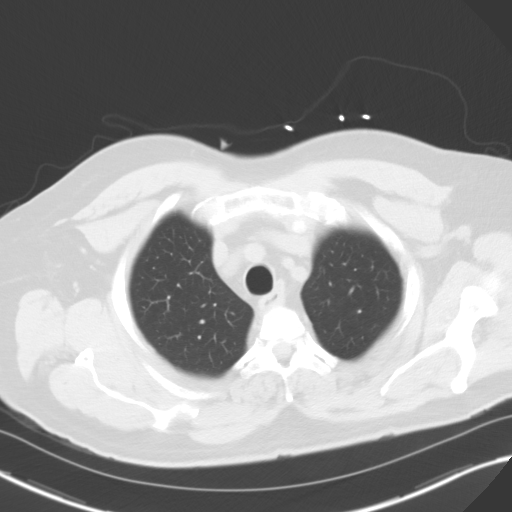

[Series 5: axial arterial · axial · arterial · 0.72mm/px · z∈[-333,-102]mm · 8 of 99 slices shown]
[im 11/99  lung]
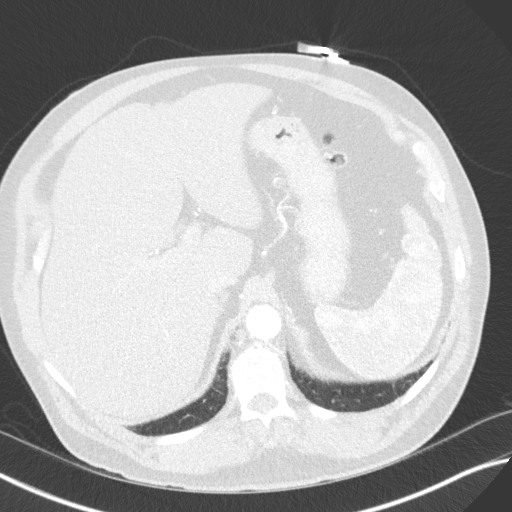
[im 22/99  soft-tissue]
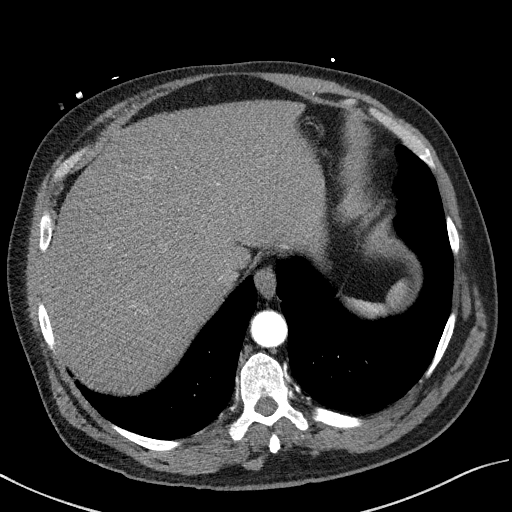
[im 33/99  lung]
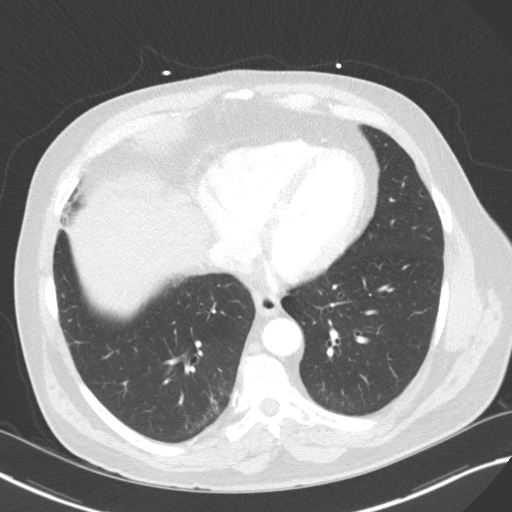
[im 44/99  soft-tissue]
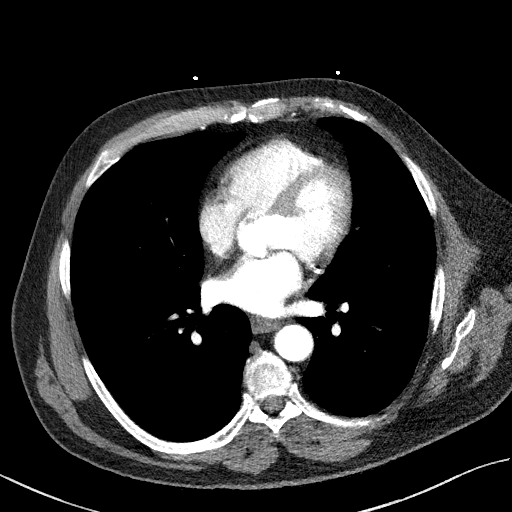
[im 55/99  lung]
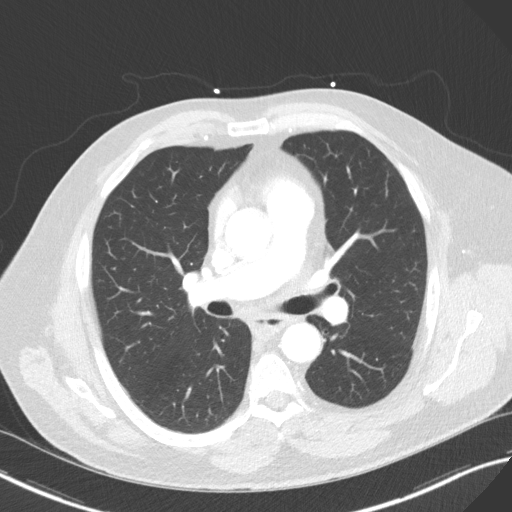
[im 66/99  soft-tissue]
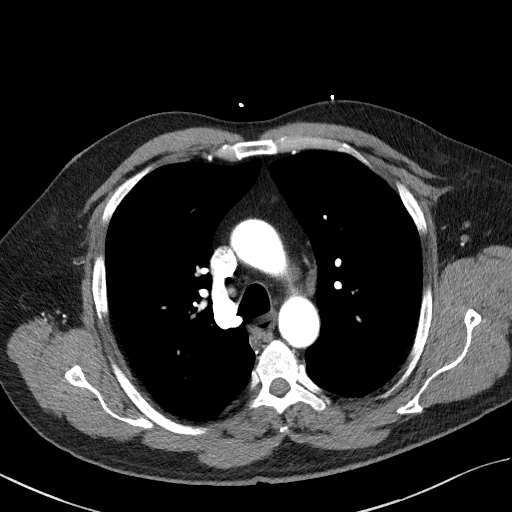
[im 77/99  lung]
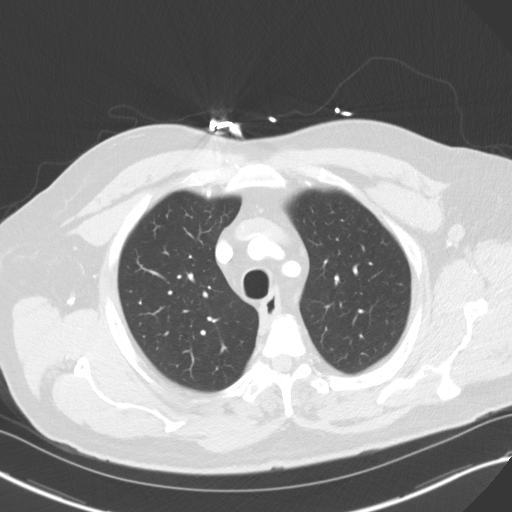
[im 88/99  soft-tissue]
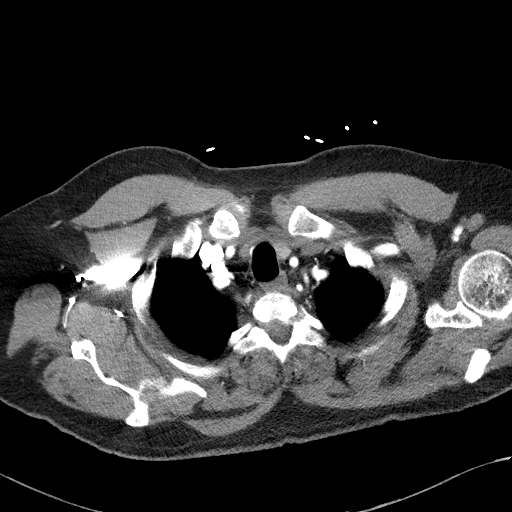

[Series 6: lung · axial · 0.72mm/px · z∈[-343,-301]mm · 2 of 148 slices shown]
[im 11/148  soft-tissue]
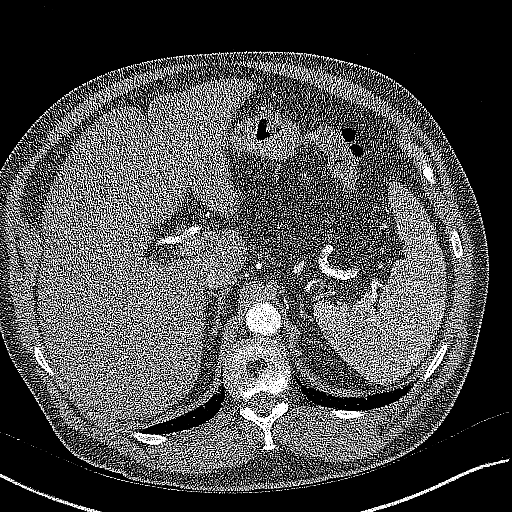
[im 32/148  soft-tissue]
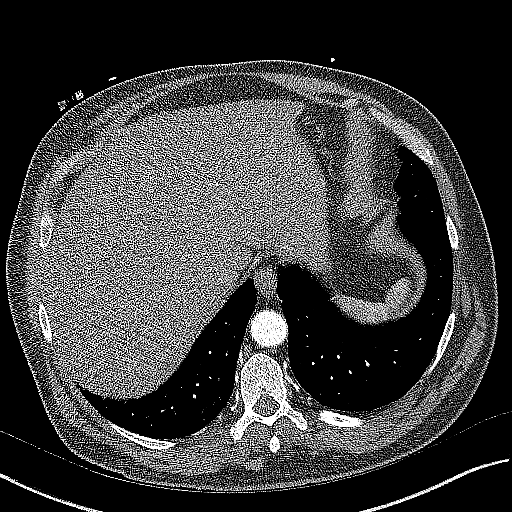

[Series 7: coronals · coronal · 0.61mm/px · 3 of 146 slices shown]
[im 37/146  soft-tissue]
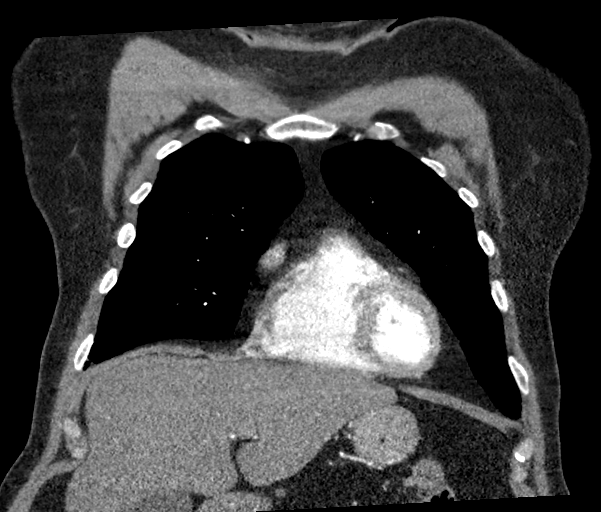
[im 73/146  soft-tissue]
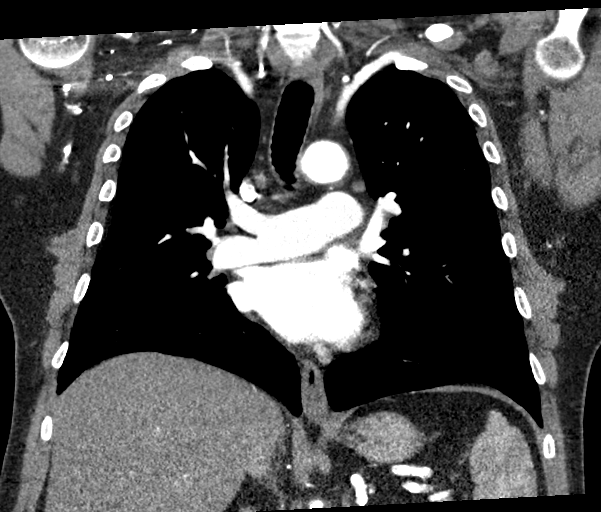
[im 109/146  soft-tissue]
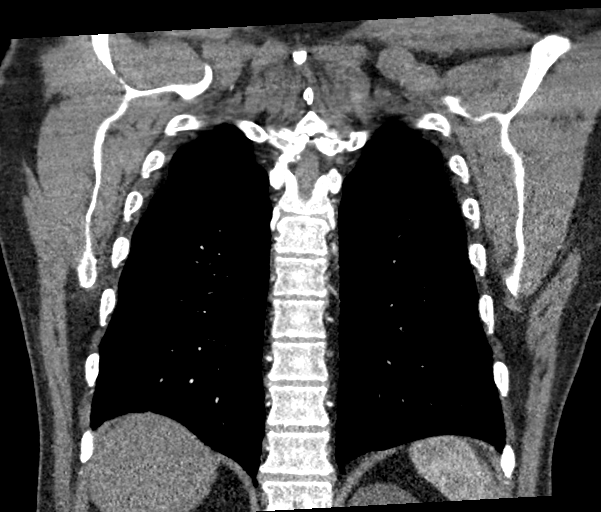

[18 of 46 positions shown; findings below may reference images not displayed]

FINDINGS: Cardiovascular: Heart size is normal. There is no significant
pericardial fluid, thickening or pericardial calcification. There is
aortic atherosclerosis, as well as atherosclerosis of the great
vessels of the mediastinum and the coronary arteries, including
calcified atherosclerotic plaque in the left main, left anterior
descending, left circumflex and right coronary arteries. Precontrast
images demonstrate no crescentic high attenuation associated with
the wall of the thoracic aorta to suggest acute intramural
hemorrhage. No aneurysm or dissection of the thoracic aorta.

Mediastinum/Nodes: No pathologically enlarged mediastinal or hilar
lymph nodes. Esophagus is unremarkable in appearance. No axillary
lymphadenopathy.

Lungs/Pleura: No suspicious appearing pulmonary nodules or masses
are noted. No acute consolidative airspace disease. No pleural
effusions.

Upper Abdomen: Low-attenuation lesions in the upper abdomen,
incompletely imaged, but correlating to large renal cysts noted on
prior CT the abdomen and pelvis 12/21/2014. Diffuse low attenuation
throughout the visualized portions of the hepatic parenchyma,
indicative of hepatic steatosis.

Musculoskeletal: There are no aggressive appearing lytic or blastic
lesions noted in the visualized portions of the skeleton.

Review of the MIP images confirms the above findings.
IMPRESSION: 1. No acute abnormality of the thoracic aorta.
2. Aortic atherosclerosis, in addition to left main and 3 vessel
coronary artery disease. Please note that although the presence of
coronary artery calcium documents the presence of coronary artery
disease, the severity of this disease and any potential stenosis
cannot be assessed on this non-gated CT examination. Assessment for
potential risk factor modification, dietary therapy or pharmacologic
therapy may be warranted, if clinically indicated.
3. Hepatic steatosis.

Aortic Atherosclerosis ([email or web address]).

## 2019-11-04 ENCOUNTER — Ambulatory Visit: Payer: Medicare Other | Admitting: Podiatry

## 2019-11-05 ENCOUNTER — Other Ambulatory Visit: Payer: Self-pay

## 2019-11-05 ENCOUNTER — Ambulatory Visit (INDEPENDENT_AMBULATORY_CARE_PROVIDER_SITE_OTHER): Payer: Medicare Other | Admitting: Podiatry

## 2019-11-05 DIAGNOSIS — M722 Plantar fascial fibromatosis: Secondary | ICD-10-CM

## 2019-11-06 ENCOUNTER — Encounter: Payer: Self-pay | Admitting: Podiatry

## 2019-11-06 NOTE — Progress Notes (Signed)
Subjective:  Patient ID: Troy Rojas, male    DOB: 10-21-1950,  MRN: [id number]  No chief complaint on file.   69 y.o. male presents with the above complaint.  Patient presents with bilateral heel pain consistent with plantar fasciitis.  Patient has been treated by Dr. Milinda Pointer every 3 months or so with a bilateral steroid injection which seems to be helping and keeping the pain down.  Patient is here for his steroid injection because it started to hurt back up again.  He denies any other acute complaints.   Review of Systems: Negative except as noted in the HPI. Denies N/V/F/Ch.  Past Medical History:  Diagnosis Date  . Aortic root dilatation ([place])    a. 06/2018 Echo: Mild dil of Ao root - asc Ao 25mm.  Marland Kitchen BPH (benign prostatic hyperplasia)   . CAD (coronary artery disease)    a. 06/2018 NSTEMI/Cath: LM nl, LAD 60ost/m, D1 100 CTO, D2 40, LCX 60p/m, 44m, RCA small, 45m. Med Rx.  . Diabetes mellitus without complication ([place])   . Diverticulosis   . Dysuria   . History of kidney stones   . HLD (hyperlipidemia)   . Hx of gout   . Hydronephrosis   . Hypertension   . Hypothyroidism   . Hypothyroidism   . Insomnia   . Ischemic cardiomyopathy    a. 06/2018 Echo: EF 55-60%, sev inf HK.  . Marginal ulcers   . Neuromuscular disorder ([place])   . Nocturia   . Over weight   . Prostatitis   . Testicular hypofunction   . Ulcer   . Urinary frequency     Current Outpatient Medications:  .  glipiZIDE (GLUCOTROL XL) 2.5 MG 24 hr tablet, Take by mouth., Disp: , Rfl:  .  allopurinol (ZYLOPRIM) 300 MG tablet, Take 1 tablet by mouth daily., Disp: , Rfl:  .  amLODipine (NORVASC) 10 MG tablet, Take 1 tablet (10 mg total) by mouth daily., Disp: 90 tablet, Rfl: 2 .  aspirin EC 81 MG EC tablet, Take 1 tablet (81 mg total) by mouth daily., Disp: 30 tablet, Rfl: 0 .  atorvastatin (LIPITOR) 40 MG tablet, Take 1 tablet (40 mg total) by mouth daily at 6 PM., Disp: 90 tablet, Rfl: 2 .  carvedilol (COREG)  6.25 MG tablet, Take 1 tablet (6.25 mg total) by mouth 2 (two) times daily with a meal., Disp: 180 tablet, Rfl: 2 .  cetirizine (ZYRTEC) 5 MG tablet, Take 10 mg by mouth daily as needed. , Disp: , Rfl:  .  clopidogrel (PLAVIX) 75 MG tablet, Take 1 tablet (75 mg total) by mouth daily with breakfast., Disp: 90 tablet, Rfl: 2 .  docusate sodium (COLACE) 100 MG capsule, Take 100 mg by mouth daily as needed for mild constipation or moderate constipation. , Disp: , Rfl:  .  fenofibrate micronized (LOFIBRA) 134 MG capsule, Take 1 capsule by mouth at bedtime., Disp: , Rfl:  .  finasteride (PROSCAR) 5 MG tablet, Take 1 tablet (5 mg total) by mouth daily., Disp: 90 tablet, Rfl: 3 .  fluticasone (FLONASE) 50 MCG/ACT nasal spray, Place 2 sprays into both nostrils daily as needed for allergies or rhinitis. , Disp: , Rfl:  .  gabapentin (NEURONTIN) 600 MG tablet, Take 600 mg by mouth 3 (three) times daily. , Disp: , Rfl:  .  Lancets (ONETOUCH DELICA PLUS [id number]) MISC, USE 3 TIMES DAILY AS INSTRUCTED, Disp: , Rfl:  .  levothyroxine (SYNTHROID, LEVOTHROID) 125 MCG tablet, Take 125  mcg by mouth daily. , Disp: , Rfl:  .  loratadine-pseudoephedrine (CLARITIN-D 24 HOUR) 10-240 MG 24 hr tablet, Take 1 tablet by mouth daily as needed for allergies. , Disp: , Rfl:  .  Melatonin 5 MG TABS, Take 1 tablet by mouth daily., Disp: , Rfl:  .  metFORMIN (GLUCOPHAGE-XR) 500 MG 24 hr tablet, Take 1,000 mg by mouth at bedtime., Disp: , Rfl:  .  nitroGLYCERIN (NITROSTAT) 0.4 MG SL tablet, Place 1 tablet (0.4 mg total) under the tongue every 5 (five) minutes as needed for chest pain., Disp: 30 tablet, Rfl: 0 .  nortriptyline (PAMELOR) 25 MG capsule, Take 25 mg by mouth at bedtime. , Disp: , Rfl:  .  omega-3 acid ethyl esters (LOVAZA) 1 g capsule, Take 1 capsule by mouth 2 (two) times daily., Disp: , Rfl:  .  ONETOUCH ULTRA test strip, daily., Disp: , Rfl:  .  Oxycodone HCl 10 MG TABS, Take 10 tablets by mouth See admin  instructions. Take 1 tablet (10mg ) by mouth five to six times daily as needed for pain, Disp: , Rfl:  .  pantoprazole (PROTONIX) 40 MG tablet, Take 40 mg by mouth daily., Disp: , Rfl:  .  senna-docusate (SENOKOT-S) 8.6-50 MG tablet, Take 1 tablet by mouth daily as needed for mild constipation. , Disp: , Rfl:  .  tamsulosin (FLOMAX) 0.4 MG CAPS capsule, TAKE 1 CAPSULE BY MOUTH ONCE A DAY (Patient taking differently: Take 0.4 mg by mouth daily. ), Disp: 30 capsule, Rfl: 11 .  zolpidem (AMBIEN) 10 MG tablet, Take 10 mg by mouth at bedtime as needed for sleep. , Disp: , Rfl:   Social History   Tobacco Use  Smoking Status Never Smoker  Smokeless Tobacco Never Used    Allergies  Allergen Reactions  . Benazepril Other (See Comments)    Other reaction(s): Tongue swelling  . Amitriptyline Other (See Comments)    Reaction unknown  . Duloxetine Hcl Other (See Comments)    Reaction unkown  . Pollen Extract Other (See Comments)    Sinus issues   Objective:  There were no vitals filed for this visit. There is no height or weight on file to calculate BMI. Constitutional Well developed. Well nourished.  Vascular Dorsalis pedis pulses palpable bilaterally. Posterior tibial pulses palpable bilaterally. Capillary refill normal to all digits.  No cyanosis or clubbing noted. Pedal hair growth normal.  Neurologic Normal speech. Oriented to person, place, and time. Epicritic sensation to light touch grossly present bilaterally.  Dermatologic Nails well groomed and normal in appearance. No open wounds. No skin lesions.  Orthopedic: Normal joint ROM without pain or crepitus bilaterally. No visible deformities. Tender to palpation at the calcaneal tuber bilaterally. No pain with calcaneal squeeze bilaterally. Ankle ROM diminished range of motion bilaterally. Silfverskiold Test: positive bilaterally.   Radiographs: None  Assessment:   1. Plantar fasciitis of right foot   2. Plantar fasciitis  of left foot    Plan:  Patient was evaluated and treated and all questions answered.  Plantar Fasciitis, bilaterally - XR reviewed as above.  - Educated on icing and stretching. Instructions given.  - Injection delivered to the plantar fascia as below. - DME: None - Pharmacologic management: None  Procedure: Injection Tendon/Ligament Location: Bilateral plantar fascia at the glabrous junction; medial approach. Skin Prep: alcohol Injectate: 0.5 cc 0.5% marcaine plain, 0.5 cc of 1% Lidocaine, 0.5 cc kenalog 10. Disposition: Patient tolerated procedure well. Injection site dressed with a band-aid.  No  follow-ups on file.

## 2019-12-09 ENCOUNTER — Other Ambulatory Visit: Payer: Self-pay

## 2019-12-09 ENCOUNTER — Encounter: Payer: Self-pay | Admitting: Internal Medicine

## 2019-12-09 ENCOUNTER — Ambulatory Visit (INDEPENDENT_AMBULATORY_CARE_PROVIDER_SITE_OTHER): Payer: Medicare Other | Admitting: Internal Medicine

## 2019-12-09 VITALS — BP 110/80 | HR 89 | Ht 66.0 in | Wt 202.4 lb

## 2019-12-09 DIAGNOSIS — E119 Type 2 diabetes mellitus without complications: Secondary | ICD-10-CM | POA: Insufficient documentation

## 2019-12-09 DIAGNOSIS — E782 Mixed hyperlipidemia: Secondary | ICD-10-CM

## 2019-12-09 DIAGNOSIS — R002 Palpitations: Secondary | ICD-10-CM

## 2019-12-09 DIAGNOSIS — E1159 Type 2 diabetes mellitus with other circulatory complications: Secondary | ICD-10-CM

## 2019-12-09 DIAGNOSIS — I25118 Atherosclerotic heart disease of native coronary artery with other forms of angina pectoris: Secondary | ICD-10-CM | POA: Diagnosis not present

## 2019-12-09 DIAGNOSIS — I10 Essential (primary) hypertension: Secondary | ICD-10-CM | POA: Diagnosis not present

## 2019-12-09 NOTE — Progress Notes (Signed)
Follow-up Outpatient Visit Date: 12/09/2019  Primary Care Provider: Maryland Pink, MD [place] [address]  Chief Complaint: Follow-up coronary artery disease  HPI:  [name] is a 69 y.o. male with history of coronary artery disease status postNSTEMI (06/2018) that has been medically managed, hypertension, hyperlipidemia, hypothyroidism, plantar fasciitis, and peripheral neuropathy, who presents for follow-up of coronary artery disease and leg pain.  I last saw him in March, at which time he was doing well other than two episodes of palpitations in January.  Monitor was obtained by his PCP, which showed occasional PACs and PVCs as well as one brief run of NSVT.  Today, [name] reports that he has been doing well with minimal palpitations.  He sometimes notices some numbness in his left arm, particularly at night when in bed.  He denies chest pain, shortness of breath, lightheadedness, and edema.  He gets tired when he exerts himself in the heat but overall feels stable to better compared with a year ago.  He has not had any significant bleeding, remaining on aspirin and clopidogrel.  --------------------------------------------------------------------------------------------------  Past Medical History:  Diagnosis Date  . Aortic root dilatation ([place])    a. 06/2018 Echo: Mild dil of Ao root - asc Ao 46mm.  Marland Kitchen BPH (benign prostatic hyperplasia)   . CAD (coronary artery disease)    a. 06/2018 NSTEMI/Cath: LM nl, LAD 60ost/m, D1 100 CTO, D2 40, LCX 60p/m, 21m, RCA small, 55m. Med Rx.  . Diabetes mellitus without complication ([place])   . Diverticulosis   . Dysuria   . History of kidney stones   . HLD (hyperlipidemia)   . Hx of gout   . Hydronephrosis   . Hypertension   . Hypothyroidism   . Hypothyroidism   . Insomnia   . Ischemic cardiomyopathy    a. 06/2018 Echo: EF 55-60%, sev inf HK.  . Marginal ulcers   . Neuromuscular disorder ([place])   .  Nocturia   . Over weight   . Prostatitis   . Testicular hypofunction   . Ulcer   . Urinary frequency    Past Surgical History:  Procedure Laterality Date  . APPENDECTOMY    . HEMORRHOID SURGERY    . LEFT HEART CATH AND CORONARY ANGIOGRAPHY N/A 07/09/2018   Procedure: LEFT HEART CATH AND CORONARY ANGIOGRAPHY;  Surgeon: Nelva Bush, MD;  Location: [facility];  Service: Cardiovascular;  Laterality: N/A;  . LITHOTRIPSY     x 3  . NASAL SINUS SURGERY    . STOMACH SURGERY    . VASECTOMY      Current Meds  Medication Sig  . allopurinol (ZYLOPRIM) 300 MG tablet Take 1 tablet by mouth daily.  Marland Kitchen amLODipine (NORVASC) 10 MG tablet Take 1 tablet (10 mg total) by mouth daily.  Marland Kitchen aspirin EC 81 MG EC tablet Take 1 tablet (81 mg total) by mouth daily.  Marland Kitchen atorvastatin (LIPITOR) 40 MG tablet Take 1 tablet (40 mg total) by mouth daily at 6 PM.  . carvedilol (COREG) 6.25 MG tablet Take 1 tablet (6.25 mg total) by mouth 2 (two) times daily with a meal.  . cetirizine (ZYRTEC) 5 MG tablet Take 10 mg by mouth daily as needed.   . clopidogrel (PLAVIX) 75 MG tablet Take 1 tablet (75 mg total) by mouth daily with breakfast.  . docusate sodium (COLACE) 100 MG capsule Take 100 mg by mouth daily as needed for mild constipation or moderate constipation.   Marland Kitchen  fenofibrate micronized (LOFIBRA) 134 MG capsule Take 1 capsule by mouth at bedtime.  . finasteride (PROSCAR) 5 MG tablet Take 1 tablet (5 mg total) by mouth daily.  . fluticasone (FLONASE) 50 MCG/ACT nasal spray Place 2 sprays into both nostrils daily as needed for allergies or rhinitis.   Marland Kitchen gabapentin (NEURONTIN) 600 MG tablet Take 600 mg by mouth 3 (three) times daily.   Marland Kitchen glipiZIDE (GLUCOTROL XL) 2.5 MG 24 hr tablet Take by mouth.  . Lancets (ONETOUCH DELICA PLUS [id number]) MISC USE 3 TIMES DAILY AS INSTRUCTED  . levothyroxine (SYNTHROID, LEVOTHROID) 125 MCG tablet Take 125 mcg by mouth daily.   Marland Kitchen loratadine-pseudoephedrine (CLARITIN-D 24  HOUR) 10-240 MG 24 hr tablet Take 1 tablet by mouth daily as needed for allergies.   . Melatonin 5 MG TABS Take 1 tablet by mouth daily.  . metFORMIN (GLUCOPHAGE-XR) 500 MG 24 hr tablet Take 1,000 mg by mouth at bedtime.  . nitroGLYCERIN (NITROSTAT) 0.4 MG SL tablet Place 1 tablet (0.4 mg total) under the tongue every 5 (five) minutes as needed for chest pain.  . nortriptyline (PAMELOR) 25 MG capsule Take 25 mg by mouth at bedtime.   Marland Kitchen omega-3 acid ethyl esters (LOVAZA) 1 g capsule Take 2 g by mouth 2 (two) times daily.  Glory Rosebush ULTRA test strip daily.  . Oxycodone HCl 10 MG TABS Take 10 tablets by mouth See admin instructions. Take 1 tablet (10mg ) by mouth five to six times daily as needed for pain  . pantoprazole (PROTONIX) 40 MG tablet Take 40 mg by mouth daily.  Marland Kitchen senna-docusate (SENOKOT-S) 8.6-50 MG tablet Take 1 tablet by mouth daily as needed for mild constipation.   . tamsulosin (FLOMAX) 0.4 MG CAPS capsule Take 0.4 mg by mouth daily.  Marland Kitchen zolpidem (AMBIEN) 10 MG tablet Take 10 mg by mouth at bedtime as needed for sleep.     Allergies: Benazepril, Amitriptyline, Duloxetine hcl, and Pollen extract  Social History   Tobacco Use  . Smoking status: Never Smoker  . Smokeless tobacco: Never Used  Vaping Use  . Vaping Use: Never used  Substance Use Topics  . Alcohol use: No  . Drug use: No    Family History  Problem Relation Age of Onset  . Kidney disease Mother   . Diabetes type II Mother   . Heart disease Mother   . Prostate cancer Neg Hx     Review of Systems: A 12-system review of systems was performed and was negative except as noted in the HPI.  --------------------------------------------------------------------------------------------------  Physical Exam: BP 110/80 (BP Location: Left Arm, Patient Position: Sitting, Cuff Size: Normal)   Pulse 89   Ht 5\' 6"  (1.676 m)   Wt 202 lb 6 oz (91.8 kg)   SpO2 97%   BMI 32.66 kg/m   General: NAD. Neck: No JVD or  HJR. Lungs: Clear to auscultation without wheezes or crackles. Heart: Regular rate and rhythm without murmurs, rubs, or gallops. Abdomen: Soft, nontender, nondistended. Extremities: No lower extremity edema.  EKG: Normal sinus rhythm with PVCs.  Otherwise, no significant abnormality.  PVCs are new from 07/29/2019.  Otherwise, no significant change.  Lab Results  Component Value Date   WBC 5.2 07/09/2018   HGB 14.5 07/09/2018   HCT 44.8 07/09/2018   MCV 88.9 07/09/2018   PLT 205 07/09/2018    Lab Results  Component Value Date   NA 138 07/15/2018   K 4.6 07/15/2018   CL 97 07/15/2018   CO2  22 07/15/2018   BUN 22 07/15/2018   CREATININE 1.28 (H) 07/15/2018   GLUCOSE 166 (H) 07/15/2018    Lab Results  Component Value Date   CHOL 234 (H) 07/07/2018   HDL 46 07/07/2018   LDLCALC 162 (H) 07/07/2018   TRIG 131 07/07/2018   CHOLHDL 5.1 07/07/2018    --------------------------------------------------------------------------------------------------  ASSESSMENT AND PLAN: Coronary artery disease with stable angina: Mr. Bise is doing well without recurrent angina.  We will continue his current antianginal regimen consisting of amlodipine and carvedilol.  We have discussed risks and benefits of long-term DAPT and have agreed to continue with this given history of multivessel CAD.  Palpitations: Monitor earlier this year through Dr. Barbarann Ehlers office showed occasional PACs and PVCs.  Given minimal symptoms, we have agreed to continue current dose of carvedilol and defer additional testing.  Hypertension: Blood pressure borderline today at 110/80.  We will defer medication changes at this time and continue to work on lifestyle modifications.  Hyperlipidemia: LDL at goal on most recent check through his PCP 6 months ago.  Continue atorvastatin 40 mg daily.  Type 2 diabetes mellitus,: Continue ongoing management per Dr. Kary Kos.  Follow-up: Return to clinic in 6  months.  Nelva Bush, MD 12/09/2019 1:58 PM

## 2019-12-09 NOTE — Patient Instructions (Signed)
Medication Instructions:  Your physician recommends that you continue on your current medications as directed. Please refer to the Current Medication list given to you today.  *If you need a refill on your cardiac medications before your next appointment, please call your pharmacy*   Lab Work: None ordered If you have labs (blood work) drawn today and your tests are completely normal, you will receive your results only by: . MyChart Message (if you have MyChart) OR . A paper copy in the mail If you have any lab test that is abnormal or we need to change your treatment, we will call you to review the results.   Testing/Procedures: None ordered   Follow-Up: At [facility], you and your health needs are our priority.  As part of our continuing mission to provide you with exceptional heart care, we have created designated Provider Care Teams.  These Care Teams include your primary Cardiologist (physician) and Advanced Practice Providers (APPs -  Physician Assistants and Nurse Practitioners) who all work together to provide you with the care you need, when you need it.  We recommend signing up for the patient portal called "MyChart".  Sign up information is provided on this After Visit Summary.  MyChart is used to connect with patients for Virtual Visits (Telemedicine).  Patients are able to view lab/test results, encounter notes, upcoming appointments, etc.  Non-urgent messages can be sent to your provider as well.   To learn more about what you can do with MyChart, go to [email or web address].    Your next appointment:   6 month(s)  The format for your next appointment:   In Person  Provider:    You may see Christopher End, MD or one of the following Advanced Practice Providers on your designated Care Team:    Christopher Berge, NP  Ryan Dunn, PA-C  Jacquelyn Visser, PA-C    Other Instructions N/A  

## 2020-02-10 ENCOUNTER — Ambulatory Visit: Payer: Medicare Other | Admitting: Podiatry

## 2020-02-15 ENCOUNTER — Ambulatory Visit (INDEPENDENT_AMBULATORY_CARE_PROVIDER_SITE_OTHER): Payer: Medicare Other | Admitting: Podiatry

## 2020-02-15 ENCOUNTER — Encounter: Payer: Self-pay | Admitting: Podiatry

## 2020-02-15 ENCOUNTER — Other Ambulatory Visit: Payer: Self-pay

## 2020-02-15 DIAGNOSIS — M722 Plantar fascial fibromatosis: Secondary | ICD-10-CM

## 2020-02-15 NOTE — Progress Notes (Signed)
Troy Rojas presents today states that he is here for his follow-up 3 months requesting injections to the bilateral heels.  Objective: Vital signs are stable he is alert oriented x3.  Pulses are palpable.  He has pain with palpation medial calcaneal tubercles bilaterally.  Assessment: Chronic intractable plantar fasciitis bilateral severe neuropathy bilateral.  Plan: Discussed etiology pathology conservative versus surgical therapies.  I injected his bilateral heels today 20 mg Kenalog 5 mg Marcaine point maximal tenderness bilateral.  Follow-up with in 3 months

## 2020-03-13 ENCOUNTER — Other Ambulatory Visit: Payer: Self-pay | Admitting: Internal Medicine

## 2020-03-16 ENCOUNTER — Other Ambulatory Visit: Payer: Self-pay | Admitting: Internal Medicine

## 2020-03-18 ENCOUNTER — Other Ambulatory Visit: Payer: Self-pay | Admitting: Internal Medicine

## 2020-05-18 ENCOUNTER — Ambulatory Visit (INDEPENDENT_AMBULATORY_CARE_PROVIDER_SITE_OTHER): Payer: Medicare Other | Admitting: Podiatry

## 2020-05-18 ENCOUNTER — Encounter: Payer: Self-pay | Admitting: Podiatry

## 2020-05-18 ENCOUNTER — Other Ambulatory Visit: Payer: Self-pay

## 2020-05-18 DIAGNOSIS — M722 Plantar fascial fibromatosis: Secondary | ICD-10-CM

## 2020-05-18 MED ORDER — TRIAMCINOLONE ACETONIDE 40 MG/ML IJ SUSP
40.0000 mg | Freq: Once | INTRAMUSCULAR | Status: AC
  Administered 2020-05-18: 14:00:00 40 mg

## 2020-05-18 NOTE — Progress Notes (Signed)
He presents today chief complaint of painful bilateral plantar fasciitis.  He states that he made it a little longer this time without much pain but he was still like to have another set of injections if he could.  Objective: Vital signs are stable alert oriented x3.  Pulses are palpable.  Neurologic sensorium is intact degenerative flexors are intact muscle strength is normal symmetrical.  He still has some loss of sensory to toes #2 and 3 particular his right foot most likely contributable to his either hammertoe deformity or uncontrolled diabetes.  Pain on palpation medial calcaneal tubercles bilateral.  Assessment: Diabetes with early diabetic peripheral neuropathy and pain in limb secondary to plantar fasciitis chronic in nature.  Plan: At this point reinjected bilateral heels 10 mg Kenalog 5 mg Marcaine point maximal tenderness bilateral heels.  Tolerated procedure well follow-up with him in a few months if necessary.  Encouraged him to exercise and to take advantage of the fact that his heels are not hurting to help reduce weight and get his diabetes under better control.

## 2020-06-08 ENCOUNTER — Other Ambulatory Visit: Payer: Self-pay

## 2020-06-08 ENCOUNTER — Ambulatory Visit: Payer: Medicare Other | Admitting: Internal Medicine

## 2020-06-08 ENCOUNTER — Encounter: Payer: Self-pay | Admitting: Internal Medicine

## 2020-06-08 VITALS — BP 120/80 | HR 69 | Ht 66.0 in | Wt 208.0 lb

## 2020-06-08 DIAGNOSIS — E785 Hyperlipidemia, unspecified: Secondary | ICD-10-CM | POA: Diagnosis not present

## 2020-06-08 DIAGNOSIS — I10 Essential (primary) hypertension: Secondary | ICD-10-CM

## 2020-06-08 DIAGNOSIS — E1169 Type 2 diabetes mellitus with other specified complication: Secondary | ICD-10-CM | POA: Diagnosis not present

## 2020-06-08 DIAGNOSIS — I25118 Atherosclerotic heart disease of native coronary artery with other forms of angina pectoris: Secondary | ICD-10-CM

## 2020-06-08 NOTE — Progress Notes (Signed)
Follow-up Outpatient Visit Date: 06/08/2020  Primary Care Provider: Maryland Pink, MD [place] [address]  Chief Complaint: Follow-up coronary artery disease  HPI:  Troy Rojas is a 70 y.o. male with history of CAD status post NSTEMI (06/2018, medically managed), HTN, HLD, hypothyroidism, plantar fasciitis, and peripheral neuropathy, who presents for follow-up of CAD.  I last saw him in 11/2019, which time he was doing well without chest pain.  He noted minimal palpitations as well as occasional numbness in the left arm.  Further testing and intervention were deferred.  Today, [name] reports that he feels about the same.  He is most concerned about chronic abdominal and leg pain.  This is unchanged from his baseline.  He has not had any significant discomfort in his arms, which was his anginal equivalent in the past.  He also denies chest pain, shortness of breath, palpitations, lightheadedness, and edema.  He has noticed some nuisance bleeding as well as significant bleeding after recently scraping his right arm.  He is not exercising regularly.  --------------------------------------------------------------------------------------------------  Past Medical History:  Diagnosis Date  . Aortic root dilatation ([place])    a. 06/2018 Echo: Mild dil of Ao root - asc Ao 44mm.  Marland Kitchen BPH (benign prostatic hyperplasia)   . CAD (coronary artery disease)    a. 06/2018 NSTEMI/Cath: LM nl, LAD 60ost/m, D1 100 CTO, D2 40, LCX 60p/m, 63m, RCA small, 13m. Med Rx.  . Diabetes mellitus without complication ([place])   . Diverticulosis   . Dysuria   . History of kidney stones   . HLD (hyperlipidemia)   . Hx of gout   . Hydronephrosis   . Hypertension   . Hypothyroidism   . Hypothyroidism   . Insomnia   . Ischemic cardiomyopathy    a. 06/2018 Echo: EF 55-60%, sev inf HK.  . Marginal ulcers   . Neuromuscular disorder ([place])   . Nocturia   . Over weight   . Prostatitis   .  Testicular hypofunction   . Ulcer   . Urinary frequency    Past Surgical History:  Procedure Laterality Date  . APPENDECTOMY    . HEMORRHOID SURGERY    . LEFT HEART CATH AND CORONARY ANGIOGRAPHY N/A 07/09/2018   Procedure: LEFT HEART CATH AND CORONARY ANGIOGRAPHY;  Surgeon: Nelva Bush, MD;  Location: [facility];  Service: Cardiovascular;  Laterality: N/A;  . LITHOTRIPSY     x 3  . NASAL SINUS SURGERY    . STOMACH SURGERY    . VASECTOMY      Current Meds  Medication Sig  . allopurinol (ZYLOPRIM) 300 MG tablet Take 1 tablet by mouth daily.  Marland Kitchen amLODipine (NORVASC) 10 MG tablet TAKE 1 TABLET BY MOUTH  DAILY  . aspirin EC 81 MG EC tablet Take 1 tablet (81 mg total) by mouth daily.  Marland Kitchen atorvastatin (LIPITOR) 40 MG tablet TAKE 1 TABLET BY MOUTH  DAILY AT 6 PM  . carvedilol (COREG) 6.25 MG tablet TAKE 1 TABLET BY MOUTH  TWICE DAILY WITH A MEAL  . cetirizine (ZYRTEC) 5 MG tablet Take 10 mg by mouth daily as needed.   . clopidogrel (PLAVIX) 75 MG tablet TAKE 1 TABLET BY MOUTH  DAILY WITH BREAKFAST  . docusate sodium (COLACE) 100 MG capsule Take 100 mg by mouth daily as needed for mild constipation or moderate constipation.   . fenofibrate micronized (LOFIBRA) 134 MG capsule Take 1 capsule by mouth at bedtime.  Marland Kitchen  finasteride (PROSCAR) 5 MG tablet Take 1 tablet (5 mg total) by mouth daily.  . fluticasone (FLONASE) 50 MCG/ACT nasal spray Place 2 sprays into both nostrils daily as needed for allergies or rhinitis.   Marland Kitchen gabapentin (NEURONTIN) 600 MG tablet Take 600 mg by mouth 3 (three) times daily.   Marland Kitchen glipiZIDE (GLUCOTROL XL) 2.5 MG 24 hr tablet Take by mouth.  . Lancets (ONETOUCH DELICA PLUS 123XX123) MISC USE 3 TIMES DAILY AS INSTRUCTED  . levothyroxine (SYNTHROID, LEVOTHROID) 125 MCG tablet Take 125 mcg by mouth daily.   Marland Kitchen loratadine-pseudoephedrine (CLARITIN-D 24-HOUR) 10-240 MG 24 hr tablet Take 1 tablet by mouth daily as needed for allergies.   . Melatonin 5 MG TABS Take 1  tablet by mouth daily.  . metFORMIN (GLUCOPHAGE-XR) 500 MG 24 hr tablet Take 1,000 mg by mouth at bedtime.  . nitroGLYCERIN (NITROSTAT) 0.4 MG SL tablet Place 1 tablet (0.4 mg total) under the tongue every 5 (five) minutes as needed for chest pain.  . nortriptyline (PAMELOR) 25 MG capsule Take 25 mg by mouth at bedtime.   Marland Kitchen omega-3 acid ethyl esters (LOVAZA) 1 g capsule Take 2 g by mouth 2 (two) times daily.  Glory Rosebush ULTRA test strip daily.  . Oxycodone HCl 10 MG TABS Take 10 tablets by mouth See admin instructions. Take 1 tablet (10mg ) by mouth five to six times daily as needed for pain  . pantoprazole (PROTONIX) 40 MG tablet Take 40 mg by mouth daily.  Marland Kitchen senna-docusate (SENOKOT-S) 8.6-50 MG tablet Take 1 tablet by mouth daily as needed for mild constipation.   . tamsulosin (FLOMAX) 0.4 MG CAPS capsule Take 0.4 mg by mouth daily.  Marland Kitchen zolpidem (AMBIEN) 10 MG tablet Take 10 mg by mouth at bedtime as needed for sleep.     Allergies: Benazepril, Amitriptyline, Duloxetine hcl, and Pollen extract  Social History   Tobacco Use  . Smoking status: Never Smoker  . Smokeless tobacco: Never Used  Vaping Use  . Vaping Use: Never used  Substance Use Topics  . Alcohol use: No  . Drug use: No    Family History  Problem Relation Age of Onset  . Kidney disease Mother   . Diabetes type II Mother   . Heart disease Mother   . Prostate cancer Neg Hx     Review of Systems: A 12-system review of systems was performed and was negative except as noted in the HPI.  --------------------------------------------------------------------------------------------------  Physical Exam: BP 120/80 (BP Location: Left Arm, Patient Position: Sitting, Cuff Size: Normal)   Pulse 69   Ht 5\' 6"  (1.676 m)   Wt 208 lb (94.3 kg)   SpO2 99%   BMI 33.57 kg/m   General:  NAD. Neck: No JVD or HJR. Lungs: Mildly diminished breath sounds throughout without wheezes or crackles Heart: Regular rate and rhythm without  murmurs, rubs, or gallops. Abdomen: Soft, nontender, nondistended. Extremities: No lower extremity edema.  EKG: Normal sinus rhythm without abnormality.  Lab Results  Component Value Date   WBC 5.2 07/09/2018   HGB 14.5 07/09/2018   HCT 44.8 07/09/2018   MCV 88.9 07/09/2018   PLT 205 07/09/2018    Lab Results  Component Value Date   NA 138 07/15/2018   K 4.6 07/15/2018   CL 97 07/15/2018   CO2 22 07/15/2018   BUN 22 07/15/2018   CREATININE 1.28 (H) 07/15/2018   GLUCOSE 166 (H) 07/15/2018    Lab Results  Component Value Date   CHOL  234 (H) 07/07/2018   HDL 46 07/07/2018   LDLCALC 162 (H) 07/07/2018   TRIG 131 07/07/2018   CHOLHDL 5.1 07/07/2018    --------------------------------------------------------------------------------------------------  ASSESSMENT AND PLAN: Coronary artery disease with stable angina: No recurrent chest/arm pain reported on current antianginal regimen of amlodipine and carvedilol.  Given that Mr. Hoben is greater than 1 year out from his NSTEMI that was medically managed and has been having quite a bit of nuisance bleeding, we have agreed to discontinue aspirin and continue indefinite clopidogrel 75 mg daily.  Continue atorvastatin and fenofibrate for secondary prevention.  Hypertension: Blood pressure reasonably well controlled with diastolic pressure at upper normal.  Continue current medications.  Hyperlipidemia associated with type 2 diabetes mellitus: LDL well controlled on last check through Dr. Kary Kos a year ago (48) though triglycerides remain mildly elevated.  He is scheduled for follow-up with Dr. Kary Kos, including labs, in the next 4 to 6 weeks.  We will defer medication changes and repeat labs today.  Follow-up: Return to clinic in 6 months.  Nelva Bush, MD 06/08/2020 2:48 PM

## 2020-06-08 NOTE — Patient Instructions (Signed)
Medication Instructions:  Your physician has recommended you make the following change in your medication:  1- STOP Aspirin.  *If you need a refill on your cardiac medications before your next appointment, please call your pharmacy*  Follow-Up: At [facility], you and your health needs are our priority.  As part of our continuing mission to provide you with exceptional heart care, we have created designated Provider Care Teams.  These Care Teams include your primary Cardiologist (physician) and Advanced Practice Providers (APPs -  Physician Assistants and Nurse Practitioners) who all work together to provide you with the care you need, when you need it.  We recommend signing up for the patient portal called "MyChart".  Sign up information is provided on this After Visit Summary.  MyChart is used to connect with patients for Virtual Visits (Telemedicine).  Patients are able to view lab/test results, encounter notes, upcoming appointments, etc.  Non-urgent messages can be sent to your provider as well.   To learn more about what you can do with MyChart, go to [email or web address].    Your next appointment:   6 month(s)  The format for your next appointment:   In Person  Provider:   You may see Nelva Bush, MD or one of the following Advanced Practice Providers on your designated Care Team:    Murray Hodgkins, NP  Christell Faith, PA-C  Marrianne Mood, PA-C  Cadence [place], [place]  Laurann [place], NP

## 2020-06-09 ENCOUNTER — Encounter: Payer: Self-pay | Admitting: Internal Medicine

## 2020-06-09 DIAGNOSIS — E1169 Type 2 diabetes mellitus with other specified complication: Secondary | ICD-10-CM | POA: Insufficient documentation

## 2020-06-21 ENCOUNTER — Other Ambulatory Visit: Payer: Self-pay | Admitting: Internal Medicine

## 2020-07-10 ENCOUNTER — Other Ambulatory Visit: Payer: Self-pay | Admitting: Internal Medicine

## 2020-07-27 ENCOUNTER — Other Ambulatory Visit: Payer: Self-pay | Admitting: Internal Medicine

## 2020-07-27 NOTE — Telephone Encounter (Signed)
Rx request sent to pharmacy.  

## 2020-08-17 ENCOUNTER — Other Ambulatory Visit: Payer: Self-pay

## 2020-08-17 ENCOUNTER — Encounter: Payer: Self-pay | Admitting: Podiatry

## 2020-08-17 ENCOUNTER — Ambulatory Visit: Payer: Medicare Other | Admitting: Podiatry

## 2020-08-17 DIAGNOSIS — M7751 Other enthesopathy of right foot: Secondary | ICD-10-CM

## 2020-08-17 DIAGNOSIS — M722 Plantar fascial fibromatosis: Secondary | ICD-10-CM | POA: Diagnosis not present

## 2020-08-17 MED ORDER — TRIAMCINOLONE ACETONIDE 40 MG/ML IJ SUSP
40.0000 mg | Freq: Once | INTRAMUSCULAR | Status: AC
  Administered 2020-08-17: 40 mg

## 2020-08-17 MED ORDER — DEXAMETHASONE SODIUM PHOSPHATE 120 MG/30ML IJ SOLN
2.0000 mg | Freq: Once | INTRAMUSCULAR | Status: AC
  Administered 2020-08-17: 2 mg via INTRA_ARTICULAR

## 2020-08-17 NOTE — Progress Notes (Signed)
He presents today for follow-up of his bilateral plantar fasciitis states that the worst pain is right at here as he points to the fifth metatarsal base of the right foot.  Objective: Vital signs are stable he is alert oriented x3 pulses are strong and palpable.  Neurologic sensorium is intact he has pain on palpation medial calcaneal tubercles bilateral a lot of his pain is noted to be over at the fourth fifth tarsometatarsal joint area and plantarly to the fifth met base.  Assessment: Chronic intractable Planter fasciitis lateral compensatory syndrome fifth metatarsal base capsulitis or bursitis.  Plan: Discussed etiology pathology conservative surgical therapies in this point time I injected 10 mg to the fifth met base area also injected 20 mg of Kenalog to the plantar aspect of the bilateral heels with local anesthetic.  Tolerated procedure well follow-up with him in 3 months if necessary.

## 2020-10-24 ENCOUNTER — Other Ambulatory Visit: Payer: Self-pay | Admitting: Internal Medicine

## 2020-11-09 ENCOUNTER — Ambulatory Visit: Payer: Medicare Other | Admitting: Podiatry

## 2020-11-09 ENCOUNTER — Other Ambulatory Visit: Payer: Self-pay

## 2020-11-09 ENCOUNTER — Encounter: Payer: Self-pay | Admitting: Podiatry

## 2020-11-09 DIAGNOSIS — M722 Plantar fascial fibromatosis: Secondary | ICD-10-CM | POA: Diagnosis not present

## 2020-11-09 DIAGNOSIS — M7751 Other enthesopathy of right foot: Secondary | ICD-10-CM

## 2020-11-09 MED ORDER — TRIAMCINOLONE ACETONIDE 40 MG/ML IJ SUSP
40.0000 mg | Freq: Once | INTRAMUSCULAR | Status: AC
  Administered 2020-11-09: 40 mg

## 2020-11-09 NOTE — Progress Notes (Signed)
He presents today for follow-up of his Planter fasciitis states that just seems to be getting worse quicker now.  Objective: Vital signs are stable alert and oriented x3.  Pain in limb secondary pain on palpation of the medial calcaneal tubercle bilaterally.  Assessment: Chronic intractable Planter fasciitis.  Plan: We inject 10 mg dispense bilaterally to the point of maximal tenderness follow-up with him in 3 months

## 2021-01-25 ENCOUNTER — Ambulatory Visit (INDEPENDENT_AMBULATORY_CARE_PROVIDER_SITE_OTHER): Payer: Medicare Other | Admitting: Podiatry

## 2021-01-25 ENCOUNTER — Other Ambulatory Visit: Payer: Self-pay

## 2021-01-25 ENCOUNTER — Encounter: Payer: Self-pay | Admitting: Podiatry

## 2021-01-25 DIAGNOSIS — M722 Plantar fascial fibromatosis: Secondary | ICD-10-CM | POA: Diagnosis not present

## 2021-01-25 MED ORDER — TRIAMCINOLONE ACETONIDE 40 MG/ML IJ SUSP
40.0000 mg | Freq: Once | INTRAMUSCULAR | Status: AC
  Administered 2021-01-25: 40 mg

## 2021-01-25 NOTE — Progress Notes (Signed)
He presents today for follow-up of his bilateral heels.  Objective: Ulcers are palpable.  He has pain on palpation medial calcaneal tubercles bilateral.  Assessment: Plantar fasciitis bilateral.  Plan: Injected bilateral heels today with 20 mg Kenalog 5 mg Marcaine point of maximal tenderness.

## 2021-03-16 ENCOUNTER — Telehealth: Payer: Self-pay | Admitting: Podiatry

## 2021-03-16 NOTE — Telephone Encounter (Signed)
Troy Rojas called today stating he really needs to see Dr Milinda Pointer.Troy Rojas has an appointment for December but pt doesn't want to wait. Troy Rojas stated he wanted to get injections, I offered him an appt with Dr Sherryle Lis but pt states Dr Milinda Pointer told him to call him when he has issues. Troy Rojas states he has pain in his feet that is getting worse.

## 2021-03-16 NOTE — Telephone Encounter (Signed)
Called pt scheduled him next Wednesday at 3:45 with Dr Milinda Pointer.

## 2021-03-16 NOTE — Telephone Encounter (Signed)
Let's do 3:45 on November 2nd

## 2021-03-22 ENCOUNTER — Other Ambulatory Visit: Payer: Self-pay | Admitting: Internal Medicine

## 2021-03-22 ENCOUNTER — Encounter: Payer: Medicare Other | Admitting: Podiatry

## 2021-03-22 NOTE — Telephone Encounter (Signed)
Please schedule overdue 6 month F/U appointment. Thank you! °

## 2021-03-23 NOTE — Telephone Encounter (Signed)
LMOV to schedule  

## 2021-04-10 ENCOUNTER — Ambulatory Visit: Payer: Medicare Other | Admitting: Podiatry

## 2021-04-10 ENCOUNTER — Other Ambulatory Visit: Payer: Self-pay

## 2021-04-10 DIAGNOSIS — M722 Plantar fascial fibromatosis: Secondary | ICD-10-CM

## 2021-04-10 NOTE — Addendum Note (Signed)
Addended by: Tyson Dense T on: 04/10/2021 03:38 PM   Modules accepted: Level of Service
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
## 2021-04-23 ENCOUNTER — Other Ambulatory Visit: Payer: Self-pay | Admitting: Internal Medicine

## 2021-04-26 ENCOUNTER — Encounter: Payer: Medicare Other | Admitting: Podiatry

## 2021-06-08 ENCOUNTER — Ambulatory Visit: Payer: Medicare Other | Admitting: Internal Medicine

## 2021-06-19 ENCOUNTER — Other Ambulatory Visit: Payer: Self-pay | Admitting: Internal Medicine

## 2021-06-19 NOTE — Progress Notes (Signed)
Follow-up Outpatient Visit Date: 06/21/2021  Primary Care Provider: Maryland Pink, MD [place] [id number]  Chief Complaint: Follow-up coronary artery disease  HPI:  Troy Rojas is a 71 y.o. male with history of CAD status post NSTEMI (06/2018, medically managed), HTN, HLD, hypothyroidism, plantar fasciitis, and peripheral neuropathy, who presents for follow-up of coronary artery disease.  I last saw him a year ago, at which time he was feeling fairly well.  He was most concerned about chronic abdominal and leg pain that has been present for years.  He had not had any chest or arm pain (anginal equivalent).  Due to some nuisance bleeding, we agreed to stop aspirin and continue indefinite clopidogrel therapy.  Today, Troy Rojas reports that he is feeling fairly well.  He denies chest pain and shortness of breath.  He notes a few sporadic episodes of right arm pain that was his anginal equivalent at the time of his NSTEMI, though pain is typically mild and short-lived.  He has not had any palpitations or lightheadedness.  He reports easy bruising and has remained on aspirin and clopidogrel since last year despite our instructions to stop aspirin.  He has otherwise not had any significant bleeding.  He notes mild leg swelling from time to time.  He admits to being sedentary and attributes his weight gain to dietary indiscretion and lack of exercise.  --------------------------------------------------------------------------------------------------  Past Medical History:  Diagnosis Date   Aortic root dilatation ([place])    a. 06/2018 Echo: Mild dil of Ao root - asc Ao 23m.   BPH (benign prostatic hyperplasia)    CAD (coronary artery disease)    a. 06/2018 NSTEMI/Cath: LM nl, LAD 60ost/m, D1 100 CTO, D2 40, LCX 60p/m, 771mRCA small, 8563med Rx.   Diabetes [place] complication [place]  Diverticulosis    Dysuria    History of kidney stones    HLD  (hyperlipidemia)    Hx of gout    Hydronephrosis    Hypertension    Hypothyroidism    Hypothyroidism    Insomnia    Ischemic cardiomyopathy    a. 06/2018 Echo: EF 55-60%, sev inf HK.   Marginal ulcers    Neuromuscular disorder ([facility])    Nocturia    Over weight    Prostatitis    Testicular hypofunction    Ulcer    Urinary frequency    Past Surgical History:  Procedure Laterality Date   APPENDECTOMY     HEMORRHOID SURGERY     LEFT HEART CATH AND CORONARY ANGIOGRAPHY N/A 07/09/2018   Procedure: LEFT HEART CATH AND CORONARY ANGIOGRAPHY;  Surgeon: EndNelva BushD;  [facility];  Service: Cardiovascular;  Laterality: N/A;   LITHOTRIPSY     x 3   NASAL SINUS SURGERY     STOMACH SURGERY     VASECTOMY      Current Meds  Medication Sig   allopurinol (ZYLOPRIM) 300 MG tablet Take 1 tablet by mouth daily.   amLODipine (NORVASC) 10 MG tablet TAKE 1 TABLET BY MOUTH  DAILY   aspirin EC 81 MG tablet Take 81 mg by mouth daily. Swallow whole.   atorvastatin (LIPITOR) 40 MG tablet TAKE 1 TABLET BY MOUTH  DAILY AT 6 PM   carvedilol (COREG) 6.25 MG tablet TAKE 1 TABLET BY MOUTH TWICE  DAILY WITH MEALS   cetirizine (ZYRTEC) 5 MG tablet Take 10 mg by mouth daily as needed.  clopidogrel (PLAVIX) 75 MG tablet TAKE 1 TABLET BY MOUTH  DAILY WITH BREAKFAST   cyclobenzaprine (FLEXERIL) 5 MG tablet Take 5 mg by mouth at bedtime as needed.   cyclobenzaprine (FLEXERIL) 5 MG tablet Take 1 tablet by mouth at bedtime as needed.   docusate sodium (COLACE) 100 MG capsule Take 100 mg by mouth daily as needed for mild constipation or moderate constipation.    fenofibrate micronized (LOFIBRA) 134 MG capsule Take 1 capsule by mouth at bedtime.   finasteride (PROSCAR) 5 MG tablet Take 1 tablet (5 mg total) by mouth daily.   fluticasone (FLONASE) 50 MCG/ACT nasal spray Place 2 sprays into both nostrils daily as needed for allergies or rhinitis.    gabapentin (NEURONTIN) 600 MG tablet Take  600 mg by mouth 3 (three) times daily.    glipiZIDE (GLUCOTROL XL) 2.5 MG 24 hr tablet Take by mouth.   Lancets (ONETOUCH DELICA PLUS [id number]) MISC USE 3 TIMES DAILY AS INSTRUCTED   levothyroxine (SYNTHROID) 137 MCG tablet Take 137 mcg by mouth daily.   loratadine-pseudoephedrine (CLARITIN-D 24-HOUR) 10-240 MG 24 hr tablet Take 1 tablet by mouth daily as needed for allergies.    Melatonin 5 MG TABS Take 1 tablet by mouth daily.   nitroGLYCERIN (NITROSTAT) 0.4 MG SL tablet Place 1 tablet (0.4 mg total) under the tongue every 5 (five) minutes as needed for chest pain.   omega-3 acid ethyl esters (LOVAZA) 1 g capsule Take 2 g by mouth 2 (two) times daily.   ONETOUCH ULTRA test strip daily.   Oxycodone HCl 10 MG TABS Take 10 tablets by mouth See admin instructions. Take 1 tablet (66m) by mouth five to six times daily as needed for pain   pantoprazole (PROTONIX) 40 MG tablet Take 40 mg by mouth daily.   pioglitazone-metformin (ACTOPLUS MET) 15-500 MG tablet Take 1 tablet by mouth 2 (two) times daily.   senna-docusate (SENOKOT-S) 8.6-50 MG tablet Take 1 tablet by mouth daily as needed for mild constipation.    tamsulosin (FLOMAX) 0.4 MG CAPS capsule Take 0.4 mg by mouth daily.   zolpidem (AMBIEN) 10 MG tablet Take 10 mg by mouth at bedtime as needed for sleep.     Allergies: Benazepril, Amitriptyline, Duloxetine hcl, and Pollen extract  Social History   Tobacco Use   Smoking status: Never   Smokeless tobacco: Never  Vaping Use   Vaping Use: Never used  Substance Use Topics   Alcohol use: No   Drug use: No    Family History  Problem Relation Age of Onset   Kidney disease Mother    Diabetes type II Mother    Heart disease Mother    Prostate cancer Neg Hx     Review of Systems: A 12-system review of systems was performed and was negative except as noted in the HPI.  --------------------------------------------------------------------------------------------------  Physical  Exam: BP 120/80 (BP Location: Left Arm, Patient Position: Sitting, Cuff Size: Large)    Pulse 68    Ht _0  (1.676 m)    Wt 234 lb (106.1 kg)    SpO2 98%    BMI 37.77 kg/m   General:  NAD. Neck: No JVD or HJR. Lungs: Clear to auscultation bilaterally without wheezes or crackles. Heart: Regular rate and rhythm without murmurs, rubs, or gallops. Abdomen: Soft, nontender, nondistended. Extremities: Trace pretibial edema bilaterally.  EKG: Normal sinus rhythm without abnormality.  Lab Results  Component Value Date   WBC 5.2 07/09/2018   HGB 14.5 07/09/2018  HCT 44.8 07/09/2018   MCV 88.9 07/09/2018   PLT 205 07/09/2018    Lab Results  Component Value Date   NA 138 07/15/2018   K 4.6 07/15/2018   CL 97 07/15/2018   CO2 22 07/15/2018   BUN 22 07/15/2018   CREATININE 1.28 (H) 07/15/2018   GLUCOSE 166 (H) 07/15/2018    Lab Results  Component Value Date   CHOL 234 (H) 07/07/2018   HDL 46 07/07/2018   LDLCALC 162 (H) 07/07/2018   TRIG 131 07/07/2018   CHOLHDL 5.1 07/07/2018    --------------------------------------------------------------------------------------------------  ASSESSMENT AND PLAN: Coronary artery disease with stable angina: No frank angina reported though Troy Rojas has experienced a few brief episodes of right arm pain that was also present at the time of his NSTEMI in 2020.  We have agreed to continue with medical therapy with amlodipine and carvedilol for antianginal treatment and defer repeat ischemia evaluation as long as his symptoms do not worsen.  Given that he is about 3 years out from his NSTEMI (managed medically) and has continued to have nuisance bleeding, we have agreed to discontinue clopidogrel and continue with aspirin 81 mg daily.  Continue secondary prevention with atorvastatin.  Hyperlipidemia associated with type 2 diabetes mellitus: LDL well controlled on last check in 07/2020 (48).  Triglycerides have been persistently elevated despite  being on fenofibrate and fish oil.  I encouraged Troy Rojas to work on lifestyle modifications.  Ongoing follow-up of lipids as well as diabetes mellitus per Dr. Kary Kos, whom Troy Rojas is scheduled to see next month.  Hypertension: Blood pressure borderline elevated today (goal less than 130/80).  Continue current doses of carvedilol and amlodipine.  Consider adding ACE inhibitor/ARB if blood pressure trends up, given history of diabetes mellitus.  Morbid obesity: BMI greater than 35 with multiple comorbidities including diabetes mellitus and coronary artery disease.  Weight loss encouraged through diet and exercise.  Follow-up: Return to clinic in 1 year.  Nelva Bush, MD 06/21/2021 4:17 PM

## 2021-06-21 ENCOUNTER — Other Ambulatory Visit: Payer: Self-pay

## 2021-06-21 ENCOUNTER — Ambulatory Visit: Payer: Medicare Other | Admitting: Internal Medicine

## 2021-06-21 ENCOUNTER — Encounter: Payer: Self-pay | Admitting: Internal Medicine

## 2021-06-21 VITALS — BP 120/80 | HR 68 | Ht 66.0 in | Wt 234.0 lb

## 2021-06-21 DIAGNOSIS — I10 Essential (primary) hypertension: Secondary | ICD-10-CM | POA: Diagnosis not present

## 2021-06-21 DIAGNOSIS — E785 Hyperlipidemia, unspecified: Secondary | ICD-10-CM

## 2021-06-21 DIAGNOSIS — I25118 Atherosclerotic heart disease of native coronary artery with other forms of angina pectoris: Secondary | ICD-10-CM

## 2021-06-21 DIAGNOSIS — E1169 Type 2 diabetes mellitus with other specified complication: Secondary | ICD-10-CM

## 2021-06-21 NOTE — Patient Instructions (Signed)
Medication Instructions:   Your physician has recommended you make the following change in your medication:   STOP Plavix (Clopidogrel)  CONTINUE Aspirin 81 mg daily   Continue all other remaining medications  *If you need a refill on your cardiac medications before your next appointment, please call your pharmacy*   Lab Work:  None ordered  Testing/Procedures:  None ordered   Follow-Up: At [facility], you and your health needs are our priority.  As part of our continuing mission to provide you with exceptional heart care, we have created designated Provider Care Teams.  These Care Teams include your primary Cardiologist (physician) and Advanced Practice Providers (APPs -  Physician Assistants and Nurse Practitioners) who all work together to provide you with the care you need, when you need it.  We recommend signing up for the patient portal called "MyChart".  Sign up information is provided on this After Visit Summary.  MyChart is used to connect with patients for Virtual Visits (Telemedicine).  Patients are able to view lab/test results, encounter notes, upcoming appointments, etc.  Non-urgent messages can be sent to your provider as well.   To learn more about what you can do with MyChart, go to [email or web address].    Your next appointment:   1 year(s)  The format for your next appointment:   In Person  Provider:   You may see Nelva Bush, MD or one of the following Advanced Practice Providers on your designated Care Team:   Murray Hodgkins, NP Christell Faith, PA-C Cadence [place], [place]

## 2021-06-22 ENCOUNTER — Encounter: Payer: Self-pay | Admitting: Internal Medicine

## 2021-07-12 ENCOUNTER — Other Ambulatory Visit: Payer: Self-pay

## 2021-07-12 ENCOUNTER — Encounter: Payer: Self-pay | Admitting: Podiatry

## 2021-07-12 ENCOUNTER — Ambulatory Visit: Payer: Medicare Other | Admitting: Podiatry

## 2021-07-12 DIAGNOSIS — M722 Plantar fascial fibromatosis: Secondary | ICD-10-CM | POA: Diagnosis not present

## 2021-07-12 MED ORDER — TRIAMCINOLONE ACETONIDE 40 MG/ML IJ SUSP
40.0000 mg | Freq: Once | INTRAMUSCULAR | Status: AC
  Administered 2021-07-12: 40 mg

## 2021-07-12 NOTE — Progress Notes (Signed)
He presents today for follow-up of his Planter fasciitis states that they have really been bothering him a lot lately.  Objective: He has pain on palpation medial calcaneal tubercles bilateral pulses are palpable bilateral.  Assessment: Pain in limb secondary to chronic intractable Planter fasciitis.  Plan: I injected 5 mg of Kenalog to the bilateral heels with local anesthetic.  She tolerated procedure well without complications follow-up with him in 3 months.

## 2021-08-03 ENCOUNTER — Other Ambulatory Visit: Payer: Self-pay | Admitting: Internal Medicine

## 2021-08-14 ENCOUNTER — Other Ambulatory Visit: Payer: Self-pay | Admitting: Internal Medicine

## 2021-10-11 ENCOUNTER — Encounter: Payer: Medicare Other | Admitting: Podiatry

## 2021-10-25 ENCOUNTER — Ambulatory Visit: Payer: Medicare Other | Admitting: Podiatry

## 2021-10-25 DIAGNOSIS — M722 Plantar fascial fibromatosis: Secondary | ICD-10-CM

## 2021-10-25 MED ORDER — TRIAMCINOLONE ACETONIDE 40 MG/ML IJ SUSP
40.0000 mg | Freq: Once | INTRAMUSCULAR | Status: AC
  Administered 2021-10-25: 40 mg

## 2021-10-25 NOTE — Progress Notes (Signed)
He presents today chief complaint of Planter fasciitis bilateral.  Plan: Discussed etiology pathology and surgical therapies.  Pain on palpation medial calcaneal tubercle.  Assessment: Plan fasciitis chronic in nature.  Plan: I injected dexamethasone local anesthetic.  Tolerated procedure well.

## 2022-01-29 ENCOUNTER — Ambulatory Visit: Payer: Medicare Other | Admitting: Podiatry

## 2022-01-29 ENCOUNTER — Encounter: Payer: Self-pay | Admitting: Podiatry

## 2022-01-29 DIAGNOSIS — M722 Plantar fascial fibromatosis: Secondary | ICD-10-CM

## 2022-01-29 MED ORDER — TRIAMCINOLONE ACETONIDE 40 MG/ML IJ SUSP
40.0000 mg | Freq: Once | INTRAMUSCULAR | Status: AC
  Administered 2022-01-29: 40 mg

## 2022-01-29 NOTE — Progress Notes (Signed)
He presents today for follow-up of his bilateral chronic intractable Planter fasciitis he states that they are not as bad today as they have been previously states that he is feeling pretty good.  Objective: Vital signs stable he is alert orient x3 pain on palpation medial calcaneal tubercles bilateral and just distal to its insertion site on medial band of the plantar fascia.  Assessment: Planter fasciitis medial band.  Plan: I injected bilateral heels today 20 mg Kenalog 5 mg Marcaine point of maximal tenderness.  Tolerated seizure well without complications.

## 2022-05-02 ENCOUNTER — Ambulatory Visit: Payer: Medicare Other | Admitting: Podiatry

## 2022-05-02 ENCOUNTER — Encounter: Payer: Self-pay | Admitting: Podiatry

## 2022-05-02 DIAGNOSIS — M722 Plantar fascial fibromatosis: Secondary | ICD-10-CM

## 2022-05-02 MED ORDER — TRIAMCINOLONE ACETONIDE 40 MG/ML IJ SUSP
40.0000 mg | Freq: Once | INTRAMUSCULAR | Status: AC
  Administered 2022-05-02: 40 mg

## 2022-05-02 NOTE — Progress Notes (Signed)
He presents today for follow-up of his bilateral heels states that he needs some shots again.  Saw him last in September.  Objective: Vital signs stable alert oriented x 3.  Pulses are palpable.  Still has pain on palpation medial calcaneal tubercles bilateral much less so than previously noted.  Assessment: Plantar fasciitis bilateral.  Plan: Injected 10 mg Kenalog 5 mg Marcaine point maximal tenderness bilateral heels.

## 2022-05-13 ENCOUNTER — Other Ambulatory Visit: Payer: Self-pay

## 2022-05-13 ENCOUNTER — Emergency Department: Payer: Medicare Other

## 2022-05-13 DIAGNOSIS — N189 Chronic kidney disease, unspecified: Secondary | ICD-10-CM | POA: Insufficient documentation

## 2022-05-13 DIAGNOSIS — N3090 Cystitis, unspecified without hematuria: Secondary | ICD-10-CM | POA: Diagnosis not present

## 2022-05-13 DIAGNOSIS — N132 Hydronephrosis with renal and ureteral calculous obstruction: Secondary | ICD-10-CM | POA: Diagnosis not present

## 2022-05-13 DIAGNOSIS — R109 Unspecified abdominal pain: Secondary | ICD-10-CM | POA: Diagnosis present

## 2022-05-13 DIAGNOSIS — N201 Calculus of ureter: Secondary | ICD-10-CM

## 2022-05-13 LAB — URINALYSIS, ROUTINE W REFLEX MICROSCOPIC
Bilirubin Urine: NEGATIVE
Glucose, UA: NEGATIVE mg/dL
Ketones, ur: NEGATIVE mg/dL
Nitrite: POSITIVE — AB
Protein, ur: 30 mg/dL — AB
RBC / HPF: >50 RBC/hpf — ABNORMAL HIGH (ref 0–5)
Specific Gravity, Urine: 1.015 (ref 1.005–1.030)
WBC, UA: >50 WBC/hpf — ABNORMAL HIGH (ref 0–5)
pH: 5 (ref 5.0–8.0)

## 2022-05-13 LAB — COMPREHENSIVE METABOLIC PANEL
ALT: 17 U/L (ref 0–44)
AST: 20 U/L (ref 15–41)
Albumin: 4 g/dL (ref 3.5–5.0)
Alkaline Phosphatase: 57 U/L (ref 38–126)
Anion gap: 7 (ref 5–15)
BUN: 28 mg/dL — ABNORMAL HIGH (ref 8–23)
CO2: 25 mmol/L (ref 22–32)
Calcium: 9.6 mg/dL (ref 8.9–10.3)
Chloride: 107 mmol/L (ref 98–111)
Creatinine, Ser: 1.36 mg/dL — ABNORMAL HIGH (ref 0.61–1.24)
GFR, Estimated: 55 mL/min — ABNORMAL LOW (ref >60)
Glucose, Bld: 134 mg/dL — ABNORMAL HIGH (ref 70–99)
Potassium: 4 mmol/L (ref 3.5–5.1)
Sodium: 139 mmol/L (ref 135–145)
Total Bilirubin: 1 mg/dL (ref 0.3–1.2)
Total Protein: 7 g/dL (ref 6.5–8.1)

## 2022-05-13 LAB — CBC WITH DIFFERENTIAL/PLATELET
Abs Immature Granulocytes: 0.07 10*3/uL (ref 0.00–0.07)
Basophils Absolute: 0 10*3/uL (ref 0.0–0.1)
Basophils Relative: 0 %
Eosinophils Absolute: 0.2 10*3/uL (ref 0.0–0.5)
Eosinophils Relative: 2 %
HCT: 43 % (ref 39.0–52.0)
Hemoglobin: 14 g/dL (ref 13.0–17.0)
Immature Granulocytes: 1 %
Lymphocytes Relative: 18 %
Lymphs Abs: 1.7 10*3/uL (ref 0.7–4.0)
MCH: 29.9 pg (ref 26.0–34.0)
MCHC: 32.6 g/dL (ref 30.0–36.0)
MCV: 91.9 fL (ref 80.0–100.0)
Monocytes Absolute: 0.9 10*3/uL (ref 0.1–1.0)
Monocytes Relative: 9 %
Neutro Abs: 6.7 10*3/uL (ref 1.7–7.7)
Neutrophils Relative %: 70 %
Platelets: 217 10*3/uL (ref 150–400)
RBC: 4.68 MIL/uL (ref 4.22–5.81)
RDW: 14.2 % (ref 11.5–15.5)
WBC: 9.5 10*3/uL (ref 4.0–10.5)
nRBC: 0 % (ref 0.0–0.2)

## 2022-05-13 LAB — LIPASE, BLOOD: Lipase: 32 U/L (ref 11–51)

## 2022-05-13 MED ORDER — KETOROLAC TROMETHAMINE 15 MG/ML IJ SOLN
15.0000 mg | Freq: Once | INTRAMUSCULAR | Status: AC
  Administered 2022-05-13: 15 mg via INTRAVENOUS
  Filled 2022-05-13: qty 1

## 2022-05-13 MED ORDER — OXYCODONE HCL 5 MG PO TABS
10.0000 mg | ORAL_TABLET | Freq: Once | ORAL | Status: AC
  Administered 2022-05-14: 10 mg via ORAL
  Filled 2022-05-13: qty 2

## 2022-05-13 MED ORDER — CEPHALEXIN 500 MG PO CAPS: 500.0000 mg | ORAL_CAPSULE | Freq: Once | ORAL | Status: DC

## 2022-05-13 MED ORDER — ONDANSETRON 4 MG PO TBDP
4.0000 mg | ORAL_TABLET | Freq: Once | ORAL | Status: AC
  Administered 2022-05-14: 4 mg via ORAL
  Filled 2022-05-13: qty 1

## 2022-05-13 MED ORDER — LACTATED RINGERS IV BOLUS: 1000.0000 mL | Freq: Once | INTRAVENOUS | Status: DC

## 2022-05-13 MED ORDER — ACETAMINOPHEN 500 MG PO TABS
1000.0000 mg | ORAL_TABLET | Freq: Once | ORAL | Status: AC
  Administered 2022-05-14: 1000 mg via ORAL
  Filled 2022-05-13: qty 2

## 2022-05-13 MED ORDER — SODIUM CHLORIDE 0.9 % IV BOLUS
1000.0000 mL | Freq: Once | INTRAVENOUS | Status: AC
  Administered 2022-05-14: 1000 mL via INTRAVENOUS

## 2022-05-13 MED ORDER — SODIUM CHLORIDE 0.9 % IV SOLN
1.0000 g | Freq: Once | INTRAVENOUS | Status: AC
  Administered 2022-05-14: 1 g via INTRAVENOUS
  Filled 2022-05-13: qty 10

## 2022-05-13 NOTE — ED Triage Notes (Signed)
Pt to er, pt states that he is here for a kidney stone, states that he has a hx of kidney stone.  Pt states that his pain stared at 430pm, states that he took 3 '10mg'$  oxycodone without relief.

## 2022-05-13 NOTE — ED Notes (Signed)
PA notified of pt's passive SI with no plan

## 2022-05-13 NOTE — ED Provider Triage Note (Addendum)
  Emergency Medicine Provider Triage Evaluation Note  Troy Rojas , a 71 y.o.male,  was evaluated in triage.  Pt complains of left-sided flank pain.  Patient states that he feels like this is one of his kidney stones that he has had in the past.  He states that he has taken a few hydrocodone prior to arrival, however it is not helping his pain.  Patient additionally endorses some passive suicidal ideation, no plan.  Denies visual or auditory hallucinations.   Review of Systems  Positive: Left-sided flank pain. Negative: Denies fever, chest pain, vomiting  Physical Exam   Vitals:   05/13/22 1813 05/13/22 1814  BP:  125/89  Pulse:  (!) 119  Resp: 18 18  Temp: 98.1 F (36.7 C)   SpO2:  98%   Gen:   Awake, appears uncomfortable. Resp:  Normal effort  MSK:   Moves extremities without difficulty  Other:    Medical Decision Making  Given the patient's initial medical screening exam, the following diagnostic evaluation has been ordered. The patient will be placed in the appropriate treatment space, once one is available, to complete the evaluation and treatment. I have discussed the plan of care with the patient and I have advised the patient that an ED physician or mid-level practitioner will reevaluate their condition after the test results have been received, as the results may give them additional insight into the type of treatment they may need.    Diagnostics: Labs, CT renal, UA  Treatments: Ketorolac.   Teodoro Spray, [place] 05/13/22 1828    Teodoro Spray, [place] 05/13/22 [phone]

## 2022-05-13 NOTE — ED Provider Notes (Signed)
[facility] Provider Note    None    (approximate)   History   Flank Pain   HPI  Troy Rojas is a 71 y.o. male who presents to the ED for evaluation of Flank Pain   I reviewed PCP visit from July.  History of depression, metabolic syndrome, BPH.  Baseline renal function around 1.3 creatinine  Patient presents to the ED for further ration of left-sided flank pain consistent with previous history of ureterolithiasis.  Denies any fevers, emesis or dysuria.  Physical Exam   Triage Vital Signs: ED Triage Vitals  Enc Vitals Group     BP 05/13/22 1814 125/89     Pulse Rate 05/13/22 1814 (!) 119     Resp 05/13/22 1813 18     Temp 05/13/22 1813 98.1 F (36.7 C)     Temp Source 05/13/22 1813 Oral     SpO2 05/13/22 1814 98 %     Weight 05/13/22 1813 212 lb (96.2 kg)     Height 05/13/22 1813 '5\' 6"'$  (1.676 m)     Head Circumference --      Peak Flow --      Pain Score 05/13/22 1813 8     Pain Loc --      Pain Edu? --      Excl. in [place]? --     Most recent vital signs: Vitals:   05/14/22 0233 05/14/22 0359  BP: 116/65 (!) 155/84  Pulse: 61 62  Resp: 18 18  Temp: 98.2 F (36.8 C)   SpO2: 97% 97%    General: Awake, no distress.  Ambulatory. CV:  Good peripheral perfusion.  Resp:  Normal effort.  Abd:  No distention.  Suprapubic and vague left-sided abdominal tenderness without peritoneal features or guarding. MSK:  No deformity noted.  Neuro:  No focal deficits appreciated. Other:     ED Results / Procedures / Treatments   Labs (all labs ordered are listed, but only abnormal results are displayed) Labs Reviewed  COMPREHENSIVE METABOLIC PANEL - Abnormal; Notable for the following components:      Result Value   Glucose, Bld 134 (*)    BUN 28 (*)    Creatinine, Ser 1.36 (*)    GFR, Estimated 55 (*)    All other components within normal limits  URINALYSIS, ROUTINE W REFLEX MICROSCOPIC - Abnormal; Notable for the following components:    Color, Urine YELLOW (*)    APPearance CLOUDY (*)    Hgb urine dipstick LARGE (*)    Protein, ur 30 (*)    Nitrite POSITIVE (*)    Leukocytes,Ua MODERATE (*)    RBC / HPF >50 (*)    WBC, UA >50 (*)    Bacteria, UA RARE (*)    All other components within normal limits  URINE CULTURE  LIPASE, BLOOD  CBC WITH DIFFERENTIAL/PLATELET    EKG   RADIOLOGY CT renal study interpreted by me with a distal left ureteral stone with some obstructive pathology. KUB interpreted by me with residual radiopaque finding concerning for residual stone.  Official radiology report(s): DG Abdomen 1 View  Result Date: 05/14/2022 CLINICAL DATA:  Left ureteral calculus seen on the earlier CT. EXAM: ABDOMEN - 1 VIEW COMPARISON:  CT dated 05/14/2019. FINDINGS: Faint radiopaque focus over the left sacral al a may correspond to the stone seen in the distal left ureter on the prior CT. Additional bilateral renal calculi as seen on the prior CT. No bowel dilatation  or evidence of obstruction. No free air. No acute osseous pathology. IMPRESSION: Faint radiopaque focus over the left sacral ala may correspond to the stone seen in the distal left ureter on the prior CT. Electronically Signed   By: Anner Crete M.D.   On: 05/14/2022 02:25   CT Renal Stone Study  Result Date: 05/13/2022 CLINICAL DATA:  Abdominal/flank pain.  Stone suspected EXAM: CT ABDOMEN AND PELVIS WITHOUT CONTRAST TECHNIQUE: Multidetector CT imaging of the abdomen and pelvis was performed following the standard protocol without IV contrast. RADIATION DOSE REDUCTION: This exam was performed according to the departmental dose-optimization program which includes automated exposure control, adjustment of the mA and/or kV according to patient size and/or use of iterative reconstruction technique. COMPARISON:  CT 12/21/2014 FINDINGS: Lower chest: No acute abnormality. Hepatobiliary: No suspicious focal liver abnormality is seen. No gallstones, gallbladder  wall thickening, or biliary dilatation. Pancreas: Unremarkable. No pancreatic ductal dilatation or surrounding inflammatory changes. Spleen: Normal in size without focal abnormality. Adrenals/Urinary Tract: Adrenal glands are unremarkable. Low-attenuation lesions in the kidneys are statistically likely to represent cysts. No follow-up is required. Moderate left hydroureteronephrosis upstream from a 5 mm stone in the distal left ureter. Additional nonobstructing calyceal stones in both kidneys measuring up to 9 mm on the right and 7 mm on the left. Bladder is unremarkable. Stomach/Bowel: Stomach is within normal limits. No evidence of bowel wall thickening, distention, or inflammatory changes. The appendix is not visualized. Colonic diverticulosis without diverticulitis. Vascular/Lymphatic: Aortic atherosclerosis. No enlarged abdominal or pelvic lymph nodes. Reproductive: Unremarkable. Other: No free intraperitoneal fluid or air. Musculoskeletal: No acute or significant osseous findings. IMPRESSION: 5 mm stone in the distal left ureter with moderate left hydroureteronephrosis. Additional nonobstructing nephrolithiasis bilaterally. Aortic Atherosclerosis (ICD10-I70.0). Electronically Signed   By: Placido Sou M.D.   On: 05/13/2022 19:19    PROCEDURES and INTERVENTIONS:  Procedures  Medications  ketorolac (TORADOL) 15 MG/ML injection 15 mg (15 mg Intravenous Given 05/13/22 1839)  acetaminophen (TYLENOL) tablet 1,000 mg (1,000 mg Oral Given 05/14/22 0037)  oxyCODONE (Oxy IR/ROXICODONE) immediate release tablet 10 mg (10 mg Oral Given 05/14/22 0038)  ondansetron (ZOFRAN-ODT) disintegrating tablet 4 mg (4 mg Oral Given 05/14/22 0039)  cefTRIAXone (ROCEPHIN) 1 g in sodium chloride 0.9 % 100 mL IVPB (0 g Intravenous Stopped 05/14/22 0232)  sodium chloride 0.9 % bolus 1,000 mL (0 mLs Intravenous Stopped 05/14/22 0232)  tamsulosin (FLOMAX) capsule 0.4 mg (0.4 mg Oral Given 05/14/22 0232)     IMPRESSION /  MDM / ASSESSMENT AND PLAN / ED COURSE  I reviewed the triage vital signs and the nursing notes.  Differential diagnosis includes, but is not limited to, ureteral stone, pyelonephritis, UTI  {Patient presents with symptoms of an acute illness or injury that is potentially life-threatening.  Patient presents with symptoms of a typical ureteral stone, but I am concerned that he has obstructive uropathy with infected urine.  His urine shows nitrites, moderate leukocytes with rare bacteria.  He has a normal lipase, CKD around baseline without worsening of his renal function.  No leukocytosis.  No sepsis.  CT confirms obstructive pathology in the left distal ureter of a 5 mm stone.  He is initially eager to leave, but I am able to convince him to stay for medications including antibiotics and fluids.  After this, I obtained a KUB that seems to demonstrate persistence of the stone.  As below I had explicit conversation with this patient urging him to stay due to my concerns  for infected obstructive uropathy.  He acknowledges this and expresses understanding that he may get worse today.  He explicitly says that it is more important that he goes home and spend time with family on Christmas dinner days to get better himself.  Acknowledges the possibility of worsening morbidity or mortality.  Clinical Course as of 05/14/22 0700  Mon May 14, 2022  0149 Reassessed.  Still with some pain [DS]  0345 I am called to the [facility] area where patient is as he is requesting to leave.  To go evaluate the patient and he is up and walking around and pacing, saying that he is ready to go.  He acknowledges that he thinks the stone is still in there, and I told him the recent x-ray suggested as much.  He is still requesting to leave.  I advised him against this and strongly urged him to stay if he has a residual obstructing stone with associated infection.  We discussed the possibility of worsening morbidity and mortality.  He  acknowledges that he may die, that he may get worse but says it is more important that he is home with his family on Christmas.  He is willing to come back if his symptoms get worse.  He looks well, I do not believe he is encephalopathic I do believe he has capacity make this decision and I repeatedly tried to get him to stay. [DS]    Clinical Course User Index [DS] Vladimir Crofts, MD     FINAL CLINICAL IMPRESSION(S) / ED DIAGNOSES   Final diagnoses:  Flank pain  Cystitis  Ureteral stone     Rx / DC Orders   ED Discharge Orders          Ordered    cephALEXin (KEFLEX) 500 MG capsule  3 times daily        05/14/22 0347             Note:  This document was prepared using Dragon voice recognition software and may include unintentional dictation errors.   Vladimir Crofts, MD 05/14/22 [phone]

## 2022-05-14 ENCOUNTER — Emergency Department: Payer: Medicare Other

## 2022-05-14 ENCOUNTER — Emergency Department
Admission: EM | Admit: 2022-05-14 | Discharge: 2022-05-14 | Disposition: A | Discharge status: Home / Self Care | Payer: Medicare Other | Attending: Emergency Medicine | Admitting: Emergency Medicine

## 2022-05-14 DIAGNOSIS — N3090 Cystitis, unspecified without hematuria: Secondary | ICD-10-CM

## 2022-05-14 DIAGNOSIS — R109 Unspecified abdominal pain: Secondary | ICD-10-CM

## 2022-05-14 DIAGNOSIS — N201 Calculus of ureter: Secondary | ICD-10-CM

## 2022-05-14 MED ORDER — CEPHALEXIN 500 MG PO CAPS: 500.0000 mg | ORAL_CAPSULE | Freq: Three times a day (TID) | ORAL | 0 refills | Status: AC

## 2022-05-14 MED ORDER — TAMSULOSIN HCL 0.4 MG PO CAPS
0.4000 mg | ORAL_CAPSULE | Freq: Once | ORAL | Status: AC
  Administered 2022-05-14: 0.4 mg via ORAL
  Filled 2022-05-14: qty 1

## 2022-05-14 NOTE — Discharge Instructions (Signed)
If your symptoms get worse then come back to the ED immediately  If you make it until Tuesday, then pick up the Keflex antibiotic prescription and start taking 3 times daily for 7 days.  Call your urologist immediately on Tuesday to be seen in the clinic

## 2022-05-15 LAB — URINE CULTURE

## 2022-06-13 ENCOUNTER — Ambulatory Visit: Payer: Medicare Other | Admitting: Urology

## 2022-06-21 ENCOUNTER — Other Ambulatory Visit: Payer: Self-pay | Admitting: Internal Medicine

## 2022-06-22 NOTE — Telephone Encounter (Signed)
Please schedule overdue F/U appointment for 90 day refills. Thank you! 

## 2022-07-03 ENCOUNTER — Ambulatory Visit: Payer: Medicare Other | Admitting: Physician Assistant

## 2022-07-10 ENCOUNTER — Encounter: Payer: Self-pay | Admitting: Medical

## 2022-07-10 ENCOUNTER — Ambulatory Visit: Payer: Medicare Other | Attending: Physician Assistant | Admitting: Medical

## 2022-07-10 VITALS — BP 144/100 | HR 96 | Ht 66.0 in | Wt 216.6 lb

## 2022-07-10 DIAGNOSIS — I25118 Atherosclerotic heart disease of native coronary artery with other forms of angina pectoris: Secondary | ICD-10-CM | POA: Diagnosis not present

## 2022-07-10 DIAGNOSIS — I10 Essential (primary) hypertension: Secondary | ICD-10-CM

## 2022-07-10 DIAGNOSIS — E782 Mixed hyperlipidemia: Secondary | ICD-10-CM | POA: Diagnosis not present

## 2022-07-10 NOTE — Progress Notes (Unsigned)
Cardiology Office Note:    Date:  07/10/2022   ID:  Troy Rojas, DOB [birth year], MRN [id number]  PCP:  Troy Pink, MD  [facility] Cardiologist:  Nelva Bush, MD  [facility] Electrophysiologist:  None   Referring MD: Troy Pink, MD   Chief Complaint: 1 year follow-up  History of Present Illness:    Troy Rojas is a 72 y.o. male with a hx of CAD status post non-STEMI in 06/2018, medically managed, hypertension, hyperlipidemia, hypothyroidism, plantar fasciitis, and peripheral neuropathy who presents for CAD.  Patient was last seen February 2023 and was feeling fairly well.  Reported a few brief episodes of right arm pain that was present at the time of the non-STEMI.  It was agreed to continue with medical therapy.  Plavix was discontinued, and aspirin was continued.  Today, the patient reports persistent right arm pain. He went to an orthopaedic surgeon who felt surgery was not recommended. He also had a steroid shot 6 months ago. He thinks something is torn. He didn't get an MRI. He denies chest pain or shortness of breat. No lower leg edema, orthopnea or pnd. BP is high, says he hasn't been taking medications. He says this is due to depression, from sisters recent passing and divorce. He quit taking ASA because he is tired of bruising.   Past Medical History:  Diagnosis Date   Aortic root dilatation ([place])    a. 06/2018 Echo: Mild dil of Ao root - asc Ao 86m.   BPH (benign prostatic hyperplasia)    CAD (coronary artery disease)    a. 06/2018 NSTEMI/Cath: LM nl, LAD 60ost/m, D1 100 CTO, D2 40, LCX 60p/m, 760mRCA small, 8583med Rx.   Diabetes mellitus without complication ([facility]  Diverticulosis    Dysuria    History of kidney stones    HLD (hyperlipidemia)    Hx of gout    Hydronephrosis    Hypertension    Hypothyroidism    Hypothyroidism    Insomnia    Ischemic cardiomyopathy    a. 06/2018 Echo: EF 55-60%, sev inf HK.   Marginal ulcers     Neuromuscular disorder ([facility])    Nocturia    Over weight    Prostatitis    Testicular hypofunction    Ulcer    Urinary frequency     Past Surgical History:  Procedure Laterality Date   APPENDECTOMY     HEMORRHOID SURGERY     LEFT HEART CATH AND CORONARY ANGIOGRAPHY N/A 07/09/2018   Procedure: LEFT HEART CATH AND CORONARY ANGIOGRAPHY;  Surgeon: EndNelva BushD;  [facility];  Service: Cardiovascular;  Laterality: N/A;   LITHOTRIPSY     x 3   NASAL SINUS SURGERY     STOMACH SURGERY     VASECTOMY      Current Medications: Current Meds  Medication Sig   allopurinol (ZYLOPRIM) 300 MG tablet Take 1 tablet by mouth daily.   amLODipine (NORVASC) 10 MG tablet TAKE 1 TABLET BY MOUTH  DAILY   ARIPiprazole (ABILIFY) 2 MG tablet Take 2 mg by mouth once.   aspirin EC 81 MG tablet Take 81 mg by mouth daily. Swallow whole.   atorvastatin (LIPITOR) 40 MG tablet TAKE 1 TABLET BY MOUTH  DAILY AT 6 PM   carvedilol (COREG) 6.25 MG tablet TAKE 1 TABLET BY MOUTH TWICE  DAILY WITH MEALS   cetirizine (ZYRTEC) 5 MG tablet Take 10 mg by mouth daily as needed.  cyclobenzaprine (FLEXERIL) 5 MG tablet Take 5 mg by mouth at bedtime as needed.   cyclobenzaprine (FLEXERIL) 5 MG tablet Take 1 tablet by mouth at bedtime as needed.   docusate sodium (COLACE) 100 MG capsule Take 100 mg by mouth daily as needed for mild constipation or moderate constipation.    escitalopram (LEXAPRO) 20 MG tablet Take 1 tablet by mouth daily.   fenofibrate micronized (LOFIBRA) 134 MG capsule Take 1 capsule by mouth at bedtime.   finasteride (PROSCAR) 5 MG tablet Take 1 tablet (5 mg total) by mouth daily.   fluticasone (FLONASE) 50 MCG/ACT nasal spray Place 2 sprays into both nostrils daily as needed for allergies or rhinitis.    gabapentin (NEURONTIN) 600 MG tablet Take 600 mg by mouth 3 (three) times daily.    glipiZIDE (GLUCOTROL XL) 2.5 MG 24 hr tablet Take by mouth.   Lancets (ONETOUCH DELICA PLUS  [id number]) MISC USE 3 TIMES DAILY AS INSTRUCTED   levothyroxine (SYNTHROID) 137 MCG tablet Take 137 mcg by mouth daily.   loratadine-pseudoephedrine (CLARITIN-D 24-HOUR) 10-240 MG 24 hr tablet Take 1 tablet by mouth daily as needed for allergies.    Melatonin 5 MG TABS Take 1 tablet by mouth daily.   nitroGLYCERIN (NITROSTAT) 0.4 MG SL tablet Place 1 tablet (0.4 mg total) under the tongue every 5 (five) minutes as needed for chest pain.   omega-3 acid ethyl esters (LOVAZA) 1 g capsule Take 2 g by mouth 2 (two) times daily.   ONETOUCH ULTRA test strip daily.   Oxycodone HCl 10 MG TABS Take 10 tablets by mouth See admin instructions. Take 1 tablet (33m) by mouth five to six times daily as needed for pain   pantoprazole (PROTONIX) 40 MG tablet Take 40 mg by mouth daily.   pioglitazone-metformin (ACTOPLUS MET) 15-500 MG tablet Take 1 tablet by mouth 2 (two) times daily.   senna-docusate (SENOKOT-S) 8.6-50 MG tablet Take 1 tablet by mouth daily as needed for mild constipation.    tamsulosin (FLOMAX) 0.4 MG CAPS capsule Take 0.4 mg by mouth daily.   zolpidem (AMBIEN) 10 MG tablet Take 10 mg by mouth at bedtime as needed for sleep.      Allergies:   Benazepril, Amitriptyline, Duloxetine hcl, and Pollen extract   Social History   Socioeconomic History   Marital status: Legally Separated    Spouse name: Not on file   Number of children: Not on file   Years of education: Not on file   Highest education level: Not on file  Occupational History   Not on file  Tobacco Use   Smoking status: Never   Smokeless tobacco: Never  Vaping Use   Vaping Use: Never used  Substance and Sexual Activity   Alcohol use: No   Drug use: No   Sexual activity: Not on file  Other Topics Concern   Not on file  Social History Narrative   Not on file   Social Determinants of Health   Financial Resource Strain: Not on file  Food Insecurity: Not on file  Transportation Needs: Not on file  Physical Activity:  Not on file  Stress: Not on file  Social Connections: Not on file     Family History: The patient's family history includes Diabetes type II in his mother; Heart disease in his mother; Kidney disease in his mother. There is no history of Prostate cancer.  ROS:   Please see the history of present illness.     All other systems  reviewed and are negative.  EKGs/Labs/Other Studies Reviewed:    The following studies were reviewed today:  Cardiac 06/2018 Conclusions: Multivessel coronary artery disease, as detailed below.  Culprit lesion for the patient's NSTEMI is most likely the RCA, which is small (<2 mm in diameter) and not a suitable target for PCI.  Occlusion of the D1 appears chronic with left-to-left collaterals. Moderate CAD involving proximal/mid LAD and mid LCx with significant calcification. Moderate to severe AV groove LCx with 70%-80% stenosis after takeoff of large OM2 branch. Mildly elevated left ventricular filling pressure.   Recommendations: Medical therapy, including carvedilol and high-intensity statin therapy. Dual antiplatelet therapy with aspirin and clopidogrel for 12 months. Patient should be discharged with a prescription for prn NTG.  If he has recurrent pain despite being on maximal antianginal therapy, FFR-guided PCI to the LAD/LCx versus CABG would need to be considered. If patient remains pain free and has no post-cath complications in recovery, he could be discharged home this afternoon.  He will need a BMP in ~1 week to reassess his renal function given.   Nelva Bush, MD [facility] Pager: [phone]       Echo 06/2018   1. The left ventricle has normal systolic function, with an ejection  fraction of 55-60%. The cavity size was normal. There is mildly increased  left ventricular wall thickness. Left ventricular diastology could not be  evaluated.   2. The mitral valve is degenerative. Mild thickening of the mitral valve  leaflet.   3.  The tricuspid valve is normal in structure.   4. The aortic valve is tricuspid.   5. The pulmonic valve was normal in structure.   6. There is mild dilatation of the aortic root and of the ascending aorta  measuring 37 mm.   7. Severe hypokinesis of the left ventricular inferior wall.   8. The interatrial septum was not well visualized.    EKG:  EKG is *** ordered today.  The ekg ordered today demonstrates ***  Recent Labs: 05/13/2022: ALT 17; BUN 28; Creatinine, Ser 1.36; Hemoglobin 14.0; Platelets 217; Potassium 4.0; Sodium 139  Recent Lipid Panel    Component Value Date/Time   CHOL 234 (H) 07/07/2018 2104   TRIG 131 07/07/2018 2104   HDL 46 07/07/2018 2104   CHOLHDL 5.1 07/07/2018 2104   VLDL 26 07/07/2018 2104   LDLCALC 162 (H) 07/07/2018 2104     Risk Assessment/Calculations:   {Does this patient have ATRIAL FIBRILLATION?:[phone]}   Physical Exam:    VS:  BP (!) 144/100 (BP Location: Left Arm, Patient Position: Sitting, Cuff Size: Large)   Pulse 96   Ht 5' 6"$  (1.676 m)   Wt 216 lb 9.6 oz (98.2 kg)   SpO2 98%   BMI 34.96 kg/m     Wt Readings from Last 3 Encounters:  07/10/22 216 lb 9.6 oz (98.2 kg)  05/13/22 212 lb (96.2 kg)  06/21/21 234 lb (106.1 kg)     GEN: *** Well nourished, well developed in no acute distress HEENT: Normal NECK: No JVD; No carotid bruits LYMPHATICS: No lymphadenopathy CARDIAC: ***RRR, no murmurs, rubs, gallops RESPIRATORY:  Clear to auscultation without rales, wheezing or rhonchi  ABDOMEN: Soft, non-tender, non-distended MUSCULOSKELETAL:  No edema; No deformity  SKIN: Warm and dry NEUROLOGIC:  Alert and oriented x 3 PSYCHIATRIC:  Normal affect   ASSESSMENT:    1. Coronary artery disease of native artery of native heart with stable angina pectoris ([place])   2. Hyperlipidemia,  mixed   3. Essential hypertension    PLAN:    In order of problems listed above:  CAD Patient denies any chest pain.  He saw orthopedic surgery for  chronic right shoulder pain.  They suspect that this is musculoskeletal in nature.  He had a steroid shot a couple months ago.  He continues to have right shoulder pain with movement.  No further ischemic workup at this time.  Patient stopped all cardiac meds due to depression from divorce and sister's recent passing.  He stopped aspirin due to easy bruising.  He was encouraged to restart aspirin 81 mg daily, Lipitor 40 mg daily, Coreg 6.25 mg twice daily.  HLD Last LDL was at goal in 2022. Patient has been off Lipitor as above. Restart Lipitor 40 mg daily. PCP can recheck cholesterol panel.  HTN Blood pressure is high, however he has not been taking his medications.  Restart amlodipine 10 mg daily and Coreg 6.25 mg twice daily.   Disposition: Follow up in 1 year(s) with MD     Signed, Crystalmarie Yasin Ninfa Meeker, PA-C  07/10/2022 3:04 PM    [facility]

## 2022-07-10 NOTE — Patient Instructions (Signed)
Medication Instructions:  Your physician recommends that you continue on your current medications as directed. Please refer to the Current Medication list given to you today.   PLEASE RESTART ALL CARDIAC MEDICATIONS *If you need a refill on your cardiac medications before your next appointment, please call your pharmacy*   Lab Work: - None If you have labs (blood work) drawn today and your tests are completely normal, you will receive your results only by: [place] (if you have MyChart) OR A paper copy in the mail If you have any lab test that is abnormal or we need to change your treatment, we will call you to review the results.   Testing/Procedures: - None   Follow-Up: At [facility], you and your health needs are our priority.  As part of our continuing mission to provide you with exceptional heart care, we have created designated Provider Care Teams.  These Care Teams include your primary Cardiologist (physician) and Advanced Practice Providers (APPs -  Physician Assistants and Nurse Practitioners) who all work together to provide you with the care you need, when you need it.  We recommend signing up for the patient portal called "MyChart".  Sign up information is provided on this After Visit Summary.  MyChart is used to connect with patients for Virtual Visits (Telemedicine).  Patients are able to view lab/test results, encounter notes, upcoming appointments, etc.  Non-urgent messages can be sent to your provider as well.   To learn more about what you can do with MyChart, go to [email or web address].    Your next appointment:   1 year(s)  Provider:   You may see Nelva Bush, MD or one of the following Advanced Practice Providers on your designated Care Team:   Murray Hodgkins, NP Christell Faith, PA-C Cadence Kathlen Mody, PA-C Gerrie Nordmann, NP   Other Instructions PLEASE RESTART [place]

## 2022-08-01 ENCOUNTER — Ambulatory Visit: Payer: Medicare Other | Admitting: Podiatry

## 2022-08-01 ENCOUNTER — Encounter: Payer: Self-pay | Admitting: Podiatry

## 2022-08-01 DIAGNOSIS — M722 Plantar fascial fibromatosis: Secondary | ICD-10-CM

## 2022-08-01 MED ORDER — TRIAMCINOLONE ACETONIDE 40 MG/ML IJ SUSP
40.0000 mg | Freq: Once | INTRAMUSCULAR | Status: AC
  Administered 2022-08-01: 40 mg

## 2022-08-01 NOTE — Progress Notes (Signed)
He presents today chief complaint of pain to his bilateral heels.  Objective: Vital signs stable he is alert oriented x 3 pain on palpation medial calcaneal tubercles pulses remain palpable.  Assessment: Pain in limb secondary to chronic intractable Planter fasciitis.  Plan: Reinjected bilateral heels today 10 mg Kenalog 5 mg Marcaine point of maximal tenderness.

## 2022-08-20 ENCOUNTER — Other Ambulatory Visit: Payer: Self-pay | Admitting: Internal Medicine

## 2022-10-16 DIAGNOSIS — F3341 Major depressive disorder, recurrent, in partial remission: Secondary | ICD-10-CM | POA: Insufficient documentation

## 2022-11-05 ENCOUNTER — Encounter: Payer: Self-pay | Admitting: Podiatry

## 2022-11-05 ENCOUNTER — Ambulatory Visit: Payer: Medicare Other | Admitting: Podiatry

## 2022-11-05 DIAGNOSIS — M722 Plantar fascial fibromatosis: Secondary | ICD-10-CM | POA: Diagnosis not present

## 2022-11-05 MED ORDER — TRIAMCINOLONE ACETONIDE 40 MG/ML IJ SUSP
40.0000 mg | Freq: Once | INTRAMUSCULAR | Status: AC
  Administered 2022-11-05: 40 mg

## 2022-11-05 NOTE — Progress Notes (Signed)
He presents today chief complaint of painful heels bilaterally.  Objective: Vital signs are stable he is alert and oriented x 3.  He has pain on palpation medial calcaneal tubercles bilateral.  There is no erythema Dem salines drainage or odor.  No pain on medial lateral compression of the calcaneus.  Assessment: Pain in limb secondary to Planter fasciitis bilateral.  Plan: Discussed etiology pathology and surgical therapies injected his heels bilateral.

## 2023-01-14 ENCOUNTER — Other Ambulatory Visit: Payer: Self-pay | Admitting: Orthopedic Surgery

## 2023-01-14 DIAGNOSIS — G8929 Other chronic pain: Secondary | ICD-10-CM

## 2023-01-14 DIAGNOSIS — M75101 Unspecified rotator cuff tear or rupture of right shoulder, not specified as traumatic: Secondary | ICD-10-CM

## 2023-01-18 ENCOUNTER — Ambulatory Visit
Admission: RE | Admit: 2023-01-18 | Discharge: 2023-01-18 | Disposition: A | Discharge status: Home / Self Care | Payer: Medicare Other | Source: Ambulatory Visit | Attending: Orthopedic Surgery | Admitting: Orthopedic Surgery

## 2023-01-18 DIAGNOSIS — M25511 Pain in right shoulder: Secondary | ICD-10-CM | POA: Insufficient documentation

## 2023-01-18 DIAGNOSIS — G8929 Other chronic pain: Secondary | ICD-10-CM

## 2023-01-18 DIAGNOSIS — M75101 Unspecified rotator cuff tear or rupture of right shoulder, not specified as traumatic: Secondary | ICD-10-CM | POA: Insufficient documentation

## 2023-02-06 ENCOUNTER — Encounter: Payer: Self-pay | Admitting: Podiatry

## 2023-02-06 ENCOUNTER — Ambulatory Visit: Payer: Medicare Other | Admitting: Podiatry

## 2023-02-06 DIAGNOSIS — M722 Plantar fascial fibromatosis: Secondary | ICD-10-CM

## 2023-02-06 MED ORDER — TRIAMCINOLONE ACETONIDE 40 MG/ML IJ SUSP
40.0000 mg | Freq: Once | INTRAMUSCULAR | Status: AC
  Administered 2023-02-06: 40 mg

## 2023-02-06 NOTE — Progress Notes (Signed)
He presents today stating that he needs his plantar fasciitis shots again.  Objective: Vital signs stable alert oriented x 3 pain on palpation medial calcaneal tubercles though not as sensitive as it has been in the past.  Assessment: Plantar fasciitis possibly resolving to some degree bilateral heels.  Plan: Reinject 20 mg each heel today Kenalog and local anesthetic.  Like to follow-up with him in about 3 months

## 2023-03-15 ENCOUNTER — Other Ambulatory Visit: Payer: Self-pay | Admitting: Medical

## 2023-05-08 ENCOUNTER — Encounter: Payer: Self-pay | Admitting: Podiatry

## 2023-05-08 ENCOUNTER — Ambulatory Visit: Payer: Medicare Other | Admitting: Podiatry

## 2023-05-08 DIAGNOSIS — M722 Plantar fascial fibromatosis: Secondary | ICD-10-CM | POA: Diagnosis not present

## 2023-05-08 MED ORDER — TRIAMCINOLONE ACETONIDE 40 MG/ML IJ SUSP
40.0000 mg | Freq: Once | INTRAMUSCULAR | Status: AC
  Administered 2023-05-08: 40 mg

## 2023-05-08 NOTE — Progress Notes (Signed)
He presents today for follow-up of his plantar fasciitis.  States think edema shots again.  Objective: Vital signs are stable alert oriented x 3 pain on palpation to calcaneal tubercles though not as sensitive as they have been previously.  Assessment: Chronic intractable plantar fasciitis bilaterally.  Plan: Reinjected 10 mg Kenalog 5 mg Marcaine point maximal tenderness bilateral heels.  Tolerated procedure well I will follow-up with him on that 3 months basis.

## 2023-05-24 ENCOUNTER — Other Ambulatory Visit: Payer: Self-pay | Admitting: Medical

## 2023-08-07 ENCOUNTER — Encounter: Payer: Self-pay | Admitting: Podiatry

## 2023-08-07 ENCOUNTER — Ambulatory Visit: Payer: Medicare Other | Admitting: Podiatry

## 2023-08-07 DIAGNOSIS — M722 Plantar fascial fibromatosis: Secondary | ICD-10-CM

## 2023-08-07 MED ORDER — TRIAMCINOLONE ACETONIDE 40 MG/ML IJ SUSP
40.0000 mg | Freq: Once | INTRAMUSCULAR | Status: AC
Start: 2023-08-07 — End: 2023-08-07
  Administered 2023-08-07: 40 mg

## 2023-08-07 NOTE — Progress Notes (Signed)
 He presents today for follow-up of his plantar fasciitis.  States think edema shots again.  Objective: Vital signs are stable alert oriented x 3 pain on palpation to calcaneal tubercles though not as sensitive as they have been previously.  Assessment: Chronic intractable plantar fasciitis bilaterally.  Plan: Reinjected 10 mg Kenalog 5 mg Marcaine point maximal tenderness bilateral heels.  Tolerated procedure well I will follow-up with him on that 3 months basis.

## 2023-08-21 ENCOUNTER — Encounter: Payer: Self-pay | Admitting: Ophthalmology

## 2023-08-21 NOTE — Anesthesia Preprocedure Evaluation (Addendum)
 Anesthesia Evaluation  Patient identified by MRN, date of birth, ID band Patient awake    Reviewed: Allergy & Precautions, H&P , NPO status , Patient's Chart, lab work & pertinent test results  Airway Mallampati: III  TM Distance: >3 FB Neck ROM: Full    Dental  (+) Partial Upper Upper bridge:   Pulmonary neg pulmonary ROS   Pulmonary exam normal breath sounds clear to auscultation       Cardiovascular hypertension, + angina  + CAD and + Past MI  Normal cardiovascular exam Rhythm:Regular Rate:Normal  07-08-18 1. The left ventricle has normal systolic function, with an ejection  fraction of 55-60%. The cavity size was normal. There is mildly increased  left ventricular wall thickness. Left ventricular diastology could not be  evaluated.   2. The mitral valve is degenerative. Mild thickening of the mitral valve  leaflet.   3. The tricuspid valve is normal in structure.   4. The aortic valve is tricuspid.   5. The pulmonic valve was normal in structure.   6. There is mild dilatation of the aortic root and of the ascending aorta  measuring 37 mm.   7. Severe hypokinesis of the left ventricular inferior wall.   8. The interatrial septum was not well visualized.    07-09-18 Conclusions: 1. Multivessel coronary artery disease, as detailed below.  Culprit lesion for the patient's NSTEMI is most likely the RCA, which is small (<2 mm in diameter) and not a suitable target for PCI.  Occlusion of the D1 appears chronic with left-to-left collaterals. 2. Moderate CAD involving proximal/mid LAD and mid LCx with significant calcification. 3. Moderate to severe AV groove LCx with 70%-80% stenosis after takeoff of large OM2 branch. 4. Mildly elevated left ventricular filling pressure.   Recommendations: 1. Medical therapy, including carvedilol and high-intensity statin therapy. 2. Dual antiplatelet therapy with aspirin and clopidogrel for 12  months. 3. Patient should be discharged with a prescription for prn NTG.  If he has recurrent pain despite being on maximal antianginal therapy, FFR-guided PCI to the LAD/LCx versus CABG would need to be considered. 4. If patient remains pain free and has no post-cath complications in recovery, he could be discharged home this afternoon.  He will need a BMP in ~1 week to reassess his renal function given.         Neuro/Psych  PSYCHIATRIC DISORDERS  Depression     Neuromuscular disease negative neurological ROS  negative psych ROS   GI/Hepatic negative GI ROS, Neg liver ROS,,,  Endo/Other  diabetesHypothyroidism    Renal/GU Renal diseasenegative Renal ROS  negative genitourinary   Musculoskeletal negative musculoskeletal ROS (+)    Abdominal   Peds negative pediatric ROS (+)  Hematology negative hematology ROS (+)   Anesthesia Other Findings  Hypertension  Ulcer Marginal ulcers  HLD (hyperlipidemia) Testicular hypofunction Diverticulosis Hypothyroidism  Dysuria BPH (benign prostatic hyperplasia) Prostatitis Nocturia  Over weight Hydronephrosis  Urinary frequency Hx of gout  Neuromuscular disorder (HCC) Insomnia  History of kidney stones Diabetes mellitus without complication Hypothyroidism CAD (coronary artery disease) Ischemic cardiomyopathy Aortic root dilatation  Restless leg syndrome Shoulder injury  Wears dentures Chronic pain syndrome   Non-STEMI 2020  Reproductive/Obstetrics negative OB ROS                             Anesthesia Physical Anesthesia Plan  ASA: 3  Anesthesia Plan: MAC   Post-op Pain Management:  Induction: Intravenous  PONV Risk Score and Plan:   Airway Management Planned: Natural Airway and Nasal Cannula  Additional Equipment:   Intra-op Plan:   Post-operative Plan:   Informed Consent: I have reviewed the patients History and Physical, chart, labs and discussed the procedure including the  risks, benefits and alternatives for the proposed anesthesia with the patient or authorized representative who has indicated his/her understanding and acceptance.     Dental Advisory Given  Plan Discussed with: Anesthesiologist, CRNA and Surgeon  Anesthesia Plan Comments: (Patient consented for risks of anesthesia including but not limited to:  - adverse reactions to medications - damage to eyes, teeth, lips or other oral mucosa - nerve damage due to positioning  - sore throat or hoarseness - Damage to heart, brain, nerves, lungs, other parts of body or loss of life  Patient voiced understanding and assent.)        Anesthesia Quick Evaluation

## 2023-08-26 NOTE — Discharge Instructions (Signed)

## 2023-08-28 ENCOUNTER — Encounter
Admission: RE | Disposition: A | Discharge status: Home / Self Care | Payer: Self-pay | Source: Ambulatory Visit | Attending: Ophthalmology

## 2023-08-28 ENCOUNTER — Encounter: Payer: Self-pay | Admitting: Ophthalmology

## 2023-08-28 ENCOUNTER — Ambulatory Visit
Admission: RE | Admit: 2023-08-28 | Discharge: 2023-08-28 | Disposition: A | Discharge status: Home / Self Care | Source: Ambulatory Visit | Attending: Ophthalmology | Admitting: Ophthalmology

## 2023-08-28 ENCOUNTER — Ambulatory Visit: Payer: Self-pay | Admitting: Anesthesiology

## 2023-08-28 ENCOUNTER — Other Ambulatory Visit: Payer: Self-pay

## 2023-08-28 DIAGNOSIS — I252 Old myocardial infarction: Secondary | ICD-10-CM | POA: Diagnosis not present

## 2023-08-28 DIAGNOSIS — I2510 Atherosclerotic heart disease of native coronary artery without angina pectoris: Secondary | ICD-10-CM | POA: Insufficient documentation

## 2023-08-28 DIAGNOSIS — G894 Chronic pain syndrome: Secondary | ICD-10-CM | POA: Insufficient documentation

## 2023-08-28 DIAGNOSIS — I10 Essential (primary) hypertension: Secondary | ICD-10-CM | POA: Insufficient documentation

## 2023-08-28 DIAGNOSIS — E1136 Type 2 diabetes mellitus with diabetic cataract: Secondary | ICD-10-CM | POA: Insufficient documentation

## 2023-08-28 DIAGNOSIS — H2512 Age-related nuclear cataract, left eye: Secondary | ICD-10-CM | POA: Diagnosis present

## 2023-08-28 DIAGNOSIS — Z7984 Long term (current) use of oral hypoglycemic drugs: Secondary | ICD-10-CM | POA: Diagnosis not present

## 2023-08-28 HISTORY — DX: Presence of dental prosthetic device (complete) (partial): Z97.2

## 2023-08-28 HISTORY — DX: Non-ST elevation (NSTEMI) myocardial infarction: I21.4

## 2023-08-28 HISTORY — DX: Unspecified injury of shoulder and upper arm, unspecified arm, initial encounter: S49.90XA

## 2023-08-28 HISTORY — DX: Restless legs syndrome: G25.81

## 2023-08-28 HISTORY — DX: Chronic pain syndrome: G89.4

## 2023-08-28 HISTORY — PX: CATARACT EXTRACTION W/PHACO: SHX586

## 2023-08-28 LAB — GLUCOSE, CAPILLARY: Glucose-Capillary: 70 mg/dL (ref 70–99)

## 2023-08-28 SURGERY — PHACOEMULSIFICATION, CATARACT, WITH IOL INSERTION
Anesthesia: Monitor Anesthesia Care | Site: Eye | Laterality: Left

## 2023-08-28 MED ORDER — LIDOCAINE HCL (PF) 2 % IJ SOLN
INTRAMUSCULAR | Status: DC | PRN
  Administered 2023-08-28: 2 mL

## 2023-08-28 MED ORDER — ARMC OPHTHALMIC DILATING DROPS
1.0000 | OPHTHALMIC | Status: DC | PRN
  Administered 2023-08-28 (×3): 1 via OPHTHALMIC

## 2023-08-28 MED ORDER — SIGHTPATH DOSE#1 BSS IO SOLN
INTRAOCULAR | Status: DC | PRN
  Administered 2023-08-28: 55 mL via OPHTHALMIC

## 2023-08-28 MED ORDER — BRIMONIDINE TARTRATE-TIMOLOL 0.2-0.5 % OP SOLN
OPHTHALMIC | Status: DC | PRN
  Administered 2023-08-28: 1 [drp] via OPHTHALMIC

## 2023-08-28 MED ORDER — TETRACAINE HCL 0.5 % OP SOLN
OPHTHALMIC | Status: AC
  Filled 2023-08-28: qty 4

## 2023-08-28 MED ORDER — FENTANYL CITRATE (PF) 100 MCG/2ML IJ SOLN
INTRAMUSCULAR | Status: DC | PRN
  Administered 2023-08-28 (×2): 50 ug via INTRAVENOUS

## 2023-08-28 MED ORDER — SIGHTPATH DOSE#1 BSS IO SOLN
INTRAOCULAR | Status: DC | PRN
  Administered 2023-08-28: 15 mL via INTRAOCULAR

## 2023-08-28 MED ORDER — ARMC OPHTHALMIC DILATING DROPS
OPHTHALMIC | Status: AC
  Filled 2023-08-28: qty 0.5

## 2023-08-28 MED ORDER — MIDAZOLAM HCL 2 MG/2ML IJ SOLN
INTRAMUSCULAR | Status: AC
Start: 2023-08-28 — End: ?
  Filled 2023-08-28: qty 2

## 2023-08-28 MED ORDER — TETRACAINE HCL 0.5 % OP SOLN
1.0000 [drp] | OPHTHALMIC | Status: DC | PRN
  Administered 2023-08-28 (×3): 1 [drp] via OPHTHALMIC

## 2023-08-28 MED ORDER — FENTANYL CITRATE (PF) 100 MCG/2ML IJ SOLN
INTRAMUSCULAR | Status: AC
  Filled 2023-08-28: qty 2

## 2023-08-28 MED ORDER — MIDAZOLAM HCL 2 MG/2ML IJ SOLN
INTRAMUSCULAR | Status: DC | PRN
  Administered 2023-08-28: 2 mg via INTRAVENOUS

## 2023-08-28 MED ORDER — SIGHTPATH DOSE#1 NA HYALUR & NA CHOND-NA HYALUR IO KIT
PACK | INTRAOCULAR | Status: DC | PRN
  Administered 2023-08-28: 1 via OPHTHALMIC

## 2023-08-28 MED ORDER — CEFUROXIME OPHTHALMIC INJECTION 1 MG/0.1 ML
INJECTION | OPHTHALMIC | Status: DC | PRN
  Administered 2023-08-28: 1 mg via INTRACAMERAL

## 2023-08-28 SURGICAL SUPPLY — 12 items
CATARACT SUITE SIGHTPATH (MISCELLANEOUS) ×1 IMPLANT
FEE CATARACT SUITE SIGHTPATH (MISCELLANEOUS) ×1 IMPLANT
GLOVE BIOGEL PI IND STRL 8 (GLOVE) ×1 IMPLANT
GLOVE SURG LX STRL 7.5 STRW (GLOVE) ×1 IMPLANT
GLOVE SURG PROTEXIS BL SZ6.5 (GLOVE) ×1 IMPLANT
GLOVE SURG SYN 6.5 PF PI BL (GLOVE) ×1 IMPLANT
LENS IOL CLAREON VIVITY 18.0 ×1 IMPLANT
LENS IOL CLRN VT YLW 18.0 IMPLANT
NDL FILTER BLUNT 18X1 1/2 (NEEDLE) ×1 IMPLANT
NEEDLE FILTER BLUNT 18X1 1/2 (NEEDLE) ×1 IMPLANT
RING MALYGIN 7.0 (MISCELLANEOUS) IMPLANT
SYR 3ML LL SCALE MARK (SYRINGE) ×1 IMPLANT

## 2023-08-28 NOTE — H&P (Signed)
 [facility]   Primary Care Physician:  Jerl Mina, MD Ophthalmologist: Dr. Lockie Mola  Pre-Procedure History & Physical: HPI:  Troy Rojas is a 73 y.o. male here for ophthalmic surgery.   Past Medical History:  Diagnosis Date   Aortic root dilatation (HCC)    a. 06/2018 Echo: Mild dil of Ao root - asc Ao 37mm.   BPH (benign prostatic hyperplasia)    CAD (coronary artery disease)    a. 06/2018 NSTEMI/Cath: LM nl, LAD 60ost/m, D1 100 CTO, D2 40, LCX 60p/m, 69m, RCA small, 44m. Med Rx.   Chronic pain syndrome    Diabetes mellitus without complication (HCC)    Diverticulosis    Dysuria    History of kidney stones    HLD (hyperlipidemia)    Hx of gout    Hydronephrosis    Hypertension    Hypothyroidism    Hypothyroidism    Insomnia    Ischemic cardiomyopathy    a. 06/2018 Echo: EF 55-60%, sev inf HK.   Marginal ulcers    Neuromuscular disorder (HCC)    Nocturia    NSTEMI (non-ST elevated myocardial infarction) (HCC)    Over weight    Prostatitis    Restless leg    Shoulder injury    right - thinks torn ligament or rotator cuff   Testicular hypofunction    Ulcer    Urinary frequency    Wears dentures    partial upper    Past Surgical History:  Procedure Laterality Date   APPENDECTOMY     HEMORRHOID SURGERY     LEFT HEART CATH AND CORONARY ANGIOGRAPHY N/A 07/09/2018   Procedure: LEFT HEART CATH AND CORONARY ANGIOGRAPHY;  Surgeon: Yvonne Kendall, MD;  Location: [facility] INVASIVE CV LAB;  Service: Cardiovascular;  Laterality: N/A;   LITHOTRIPSY     x 3   NASAL SINUS SURGERY     STOMACH SURGERY     VASECTOMY      Prior to Admission medications   Medication Sig Start Date End Date Taking? Authorizing Provider  allopurinol (ZYLOPRIM) 300 MG tablet Take 1 tablet by mouth daily. 11/14/15  Yes [provider]  amLODipine (NORVASC) 10 MG tablet TAKE 1 TABLET BY MOUTH  DAILY 08/03/21  Yes End, Cristal Deer, MD  aspirin EC 81 MG tablet Take 81 mg  by mouth daily. Swallow whole.   Yes [provider]  atorvastatin (LIPITOR) 40 MG tablet TAKE 1 TABLET BY MOUTH  DAILY AT 6 PM 08/03/21  Yes End, Cristal Deer, MD  carvedilol (COREG) 6.25 MG tablet TAKE 1 TABLET BY MOUTH TWICE  DAILY WITH MEALS Patient taking differently: Take 3.125 mg by mouth 2 (two) times daily with a meal. 05/24/23  Yes Furth, Cadence H, PA-C  cetirizine (ZYRTEC) 5 MG tablet Take 10 mg by mouth daily as needed.    Yes [provider]  cyclobenzaprine (FLEXERIL) 5 MG tablet Take 5 mg by mouth at bedtime as needed. 11/29/20  Yes [provider]  diphenoxylate-atropine (LOMOTIL) 2.5-0.025 MG tablet Take by mouth daily as needed for diarrhea or loose stools.   Yes [provider]  fenofibrate micronized (LOFIBRA) 134 MG capsule Take 1 capsule by mouth at bedtime. 11/14/15  Yes [provider]  finasteride (PROSCAR) 5 MG tablet Take 1 tablet (5 mg total) by mouth daily. 01/25/15  Yes McGowan, Carollee Herter A, PA-C  fluticasone (FLONASE) 50 MCG/ACT nasal spray Place 2 sprays into both nostrils daily as needed for allergies or rhinitis.  Yes [provider]  glipiZIDE (GLUCOTROL XL) 2.5 MG 24 hr tablet Take 5 mg by mouth daily. 09/24/19  Yes [provider]  hydrocortisone-pramoxine [facility]) 2.5-1 % rectal cream Place 1 Application rectally 3 (three) times daily.   Yes [provider]  levothyroxine (SYNTHROID) 137 MCG tablet Take 150 mcg by mouth daily. 1 tab daily 6 days per week.  On 7th day take 1/2 tab 01/21/21  Yes [provider]  loratadine-pseudoephedrine (CLARITIN-D 24-HOUR) 10-240 MG 24 hr tablet Take 1 tablet by mouth daily as needed for allergies.    Yes [provider]  nitroGLYCERIN (NITROSTAT) 0.4 MG SL tablet Place 1 tablet (0.4 mg total) under the tongue every 5 (five) minutes as needed for chest pain. 07/09/18  Yes Lule, Joana, PA  nortriptyline (PAMELOR) 25 MG capsule Take 25 mg by mouth at  bedtime.   Yes [provider]  Oxycodone HCl 10 MG TABS Take 10 tablets by mouth 2 (two) times daily.   Yes [provider]  pantoprazole (PROTONIX) 40 MG tablet Take 40 mg by mouth daily.   Yes [provider]  pioglitazone-metformin (ACTOPLUS MET) 15-500 MG tablet Take 1 tablet by mouth 2 (two) times daily. 12/29/20  Yes [provider]  venlafaxine XR (EFFEXOR-XR) 150 MG 24 hr capsule Take 150 mg by mouth daily.   Yes [provider]  zolpidem (AMBIEN) 10 MG tablet Take 15 mg by mouth at bedtime as needed for sleep.   Yes [provider]  gabapentin (NEURONTIN) 600 MG tablet Take 900 mg by mouth 2 (two) times daily. Patient not taking: Reported on 08/21/2023    [provider]  Lancets [facility] DELICA PLUS [place]) MISC USE 3 TIMES DAILY AS INSTRUCTED 03/10/19   [provider]  omega-3 acid ethyl esters (LOVAZA) 1 g capsule Take 2 g by mouth 2 (two) times daily. Patient not taking: Reported on 08/21/2023    [provider]  [facility] ULTRA test strip daily. 03/10/19   [provider]  potassium chloride SA (KLOR-CON M) 20 MEQ tablet Take 20 mEq by mouth 2 (two) times daily. Patient not taking: Reported on 08/21/2023 06/13/22   [provider]    Allergies as of 08/14/2023 - Review Complete 08/07/2023  Allergen Reaction Noted   Benazepril Other (See Comments) 05/04/2016   Amitriptyline Other (See Comments) 12/20/2014   Duloxetine hcl Other (See Comments) 12/20/2014   Aripiprazole  08/13/2022   Pollen extract Other (See Comments) 12/20/2014    Family History  Problem Relation Age of Onset   Kidney disease Mother    Diabetes type II Mother    Heart disease Mother    Prostate cancer Neg Hx     Social History   Socioeconomic History   Marital status: Divorced    Spouse name: Not on file   Number of children: Not on file   Years of education: Not on file   Highest education level: Not  on file  Occupational History   Not on file  Tobacco Use   Smoking status: Never   Smokeless tobacco: Never  Vaping Use   Vaping status: Never Used  Substance and Sexual Activity   Alcohol use: No   Drug use: No   Sexual activity: Not on file  Other Topics Concern   Not on file  Social History Narrative   Not on file   Social Drivers of Health   Financial Resource Strain: Low Risk  (08/13/2023)   Received  from [facility]   Overall Financial Resource Strain (CARDIA)    Difficulty of Paying Living Expenses: Not hard at all  Food Insecurity: No Food Insecurity (08/13/2023)   Received from [facility]   Hunger Vital Sign    Worried About Running Out of Food in the Last Year: Never true    Ran Out of Food in the Last Year: Never true  Transportation Needs: No Transportation Needs (08/13/2023)   Received from [facility] - Transportation    In the past 12 months, has lack of transportation kept you from medical appointments or from getting medications?: No    Lack of Transportation (Non-Medical): No  Physical Activity: Not on file  Stress: Not on file  Social Connections: Not on file  Intimate Partner Violence: Not on file    Review of Systems: See HPI, otherwise negative ROS  Physical Exam: BP 113/80   Pulse (!) 110   Temp (!) 97.4 F (36.3 C) (Temporal)   Resp 16   Ht 5\' 6"  (1.676 m)   Wt 102.1 kg   SpO2 98%   BMI 36.32 kg/m  General:   Alert,  pleasant and cooperative in NAD Head:  Normocephalic and atraumatic. Lungs:  Clear to auscultation.    Heart:  Regular rate and rhythm.   Impression/Plan: Larene Pickett is here for ophthalmic surgery.  Risks, benefits, limitations, and alternatives regarding ophthalmic surgery have been reviewed with the patient.  Questions have been answered.  All parties agreeable.   Lockie Mola, MD  08/28/2023, 7:57 AM

## 2023-08-28 NOTE — Transfer of Care (Signed)
 Immediate Anesthesia Transfer of Care Note  Patient: Troy Rojas  Procedure(s) Performed: PHACOEMULSIFICATION, CATARACT, WITH IOL INSERTION 6.59 (Left: Eye)  Patient Location: PACU  Anesthesia Type: MAC  Level of Consciousness: awake, alert  and patient cooperative  Airway and Oxygen Therapy: Patient Spontanous Breathing and Patient connected to supplemental oxygen  Post-op Assessment: Post-op Vital signs reviewed, Patient's Cardiovascular Status Stable, Respiratory Function Stable, Patent Airway and No signs of Nausea or vomiting  Post-op Vital Signs: Reviewed and stable  Complications: No notable events documented.

## 2023-08-28 NOTE — Op Note (Signed)
 OPERATIVE NOTE  EASTEN MACEACHERN [id number] 08/28/2023  PREOPERATIVE DIAGNOSIS:   Nuclear sclerotic cataract left eye with miotic pupil      H25.12   POSTOPERATIVE DIAGNOSIS:   Nuclear sclerotic cataract left eye with miotic pupil.     PROCEDURE:  Phacoemulsification with posterior chamber intraocular lens implantation of the left eye which required pupil stretching with the Malyugin pupil expansion device  Ultrasound time: Procedure(s): PHACOEMULSIFICATION, CATARACT, WITH IOL INSERTION 6.59 00:45.5 (Left)  LENS:   Implant Name Type Inv. Item Serial No. Manufacturer Lot No. LRB No. Used Action  LENS IOL CLAREON VIVITY 18.0 - [id number]  LENS IOL CLAREON VIVITY 18.0 [id number] [employer]  Left 1 Implanted        [id number]  SURGEON:  Deirdre Evener, MD   ANESTHESIA: Topical with tetracaine drops and 2% Xylocaine jelly, augmented with 1% preservative-free intracameral lidocaine.   COMPLICATIONS:  None.   DESCRIPTION OF PROCEDURE:  The patient was identified in the holding room and transported to the operating room and placed in the supine position under the operating microscope.  The left eye was identified as the operative eye and it was prepped and draped in the usual sterile ophthalmic fashion.   A 1 millimeter clear-corneal paracentesis was made at the 1:30 position.  The anterior chamber was filled with Viscoat viscoelastic.  0.5 ml of preservative-free 1% lidocaine was injected into the anterior chamber.  A 2.4 millimeter keratome was used to make a near-clear corneal incision at the 10:30 position.  A Malyugin pupil expander was then placed through the main incision and into the anterior chamber of the eye.  The edge of the iris was secured on the lip of the pupil expander and it was released, thereby expanding the pupil to approximately 7 millimeters for completion of the cataract surgery.  Additional Viscoat was placed in the anterior chamber.  A cystotome and capsulorrhexis  forceps were used to make a curvilinear capsulorrhexis.   Balanced salt solution was used to hydrodissect and hydrodelineate the lens nucleus.   Phacoemulsification was used in stop and chop fashion to remove the lens, nucleus and epinucleus.  The remaining cortex was aspirated using the irrigation aspiration handpiece.  Additional Provisc was placed into the eye to distend the capsular bag for lens placement.  A lens was then injected into the capsular bag.  The pupil expanding ring was removed using a Kuglen hook and insertion device. The remaining viscoelastic was aspirated from the capsular bag and the anterior chamber.  The anterior chamber was filled with balanced salt solution to inflate to a physiologic pressure.   Wounds were hydrated with balanced salt solution.  The anterior chamber was inflated to a physiologic pressure with balanced salt solution.  No wound leaks were noted. Cefuroxime 0.1 ml of a 10mg /ml solution was injected into the anterior chamber for a dose of 1 mg of intracameral antibiotic at the completion of the case.   Timolol and Brimonidine drops were applied to the eye.  The patient was taken to the recovery room in stable condition without complications of anesthesia or surgery.  Cattie Tineo 08/28/2023, 9:46 AM

## 2023-08-28 NOTE — Anesthesia Postprocedure Evaluation (Signed)
 Anesthesia Post Note  Patient: JARI DIPASQUALE  Procedure(s) Performed: PHACOEMULSIFICATION, CATARACT, WITH IOL INSERTION 6.59 00:45.5 (Left: Eye)  Patient location during evaluation: PACU Anesthesia Type: MAC Level of consciousness: awake and alert Pain management: pain level controlled Vital Signs Assessment: post-procedure vital signs reviewed and stable Respiratory status: spontaneous breathing, nonlabored ventilation, respiratory function stable and patient connected to nasal cannula oxygen Cardiovascular status: stable and blood pressure returned to baseline Postop Assessment: no apparent nausea or vomiting Anesthetic complications: no   No notable events documented.   Last Vitals:  Vitals:   08/28/23 0952 08/28/23 0956  BP: (!) 85/68 106/88  Pulse: (!) 105 (!) 101  Resp: 18 14  Temp:  37.1 C  SpO2: 95% 97%    Last Pain:  Vitals:   08/28/23 0731  TempSrc: Temporal  PainSc: 0-No pain                 Keyan Folson C [place] St[place]

## 2023-09-04 NOTE — Anesthesia Preprocedure Evaluation (Addendum)
 Anesthesia Evaluation  Patient identified by MRN, date of birth, ID band Patient awake    Reviewed: Allergy & Precautions, H&P , NPO status , Patient's Chart, lab work & pertinent test results  Airway Mallampati: III  TM Distance: >3 FB Neck ROM: Full    Dental no notable dental hx.  Upper bridge :   Pulmonary neg pulmonary ROS   Pulmonary exam normal breath sounds clear to auscultation       Cardiovascular hypertension, + angina  + CAD and + Past MI  negative cardio ROS Normal cardiovascular exam Rhythm:Regular Rate:Normal  07-08-18 1. The left ventricle has normal systolic function, with an ejection  fraction of 55-60%. The cavity size was normal. There is mildly increased  left ventricular wall thickness. Left ventricular diastology could not be  evaluated.   2. The mitral valve is degenerative. Mild thickening of the mitral valve  leaflet.   3. The tricuspid valve is normal in structure.   4. The aortic valve is tricuspid.   5. The pulmonic valve was normal in structure.   6. There is mild dilatation of the aortic root and of the ascending aorta  measuring 37 mm.   7. Severe hypokinesis of the left ventricular inferior wall.   8. The interatrial septum was not well visualized.     07-09-18 Conclusions: 1.         Multivessel coronary artery disease, as detailed below.  Culprit lesion for the patient's NSTEMI is most likely the RCA, which is small (<2 mm in diameter) and not a suitable target for PCI.  Occlusion of the D1 appears chronic with left-to-left collaterals. 2.Moderate CAD involving proximal/mid LAD and mid LCx with significant calcification. 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 4.Mildly elevated left ventricular filling pressure.   Recommendations: 1.Medical therapy, including carvedilol  and high-intensity statin therapy. 2.Dual antiplatelet therapy with  aspirin  and clopidogrel  for 12 months. 3.         Patient should be discharged with a prescription for prn NTG.  If he has recurrent pain despite being on maximal antianginal therapy, FFR-guided PCI to the LAD/LCx versus CABG would need to be considered. 4.         If patient remains pain free and has no post-cath complications in recovery, he could be discharged home this afternoon.  He will need a BMP in ~1 week to reassess his renal function given.              Neuro/Psych  PSYCHIATRIC DISORDERS  Depression     Neuromuscular disease negative neurological ROS  negative psych ROS   GI/Hepatic negative GI ROS, Neg liver ROS,,,  Endo/Other  negative endocrine ROSdiabetesHypothyroidism    Renal/GU Renal diseasenegative Renal ROS  negative genitourinary   Musculoskeletal negative musculoskeletal ROS (+)    Abdominal   Peds negative pediatric ROS (+)  Hematology negative hematology ROS (+)   Anesthesia Other Findings Previous cataract 08-28-23   Hypertension             Ulcer Marginal ulcers   HLD (hyperlipidemia) Testicular hypofunctionDiverticulosis Hypothyroidism             Dysuria BPH (benign prostatic hyperplasia)Prostatitis Nocturia             Over weight Hydronephrosis             Urinary frequency Hx of gout             Neuromuscular  disorder (HCC) Insomnia             History of kidney stones Diabetes mellitus without complication Hypothyroidism CAD (coronary artery disease)Ischemic cardiomyopathy Aortic root dilatation  Restless leg syndrome Shoulder injury    Wears dentures Chronic pain syndrome              Non-STEMI 2020   Reproductive/Obstetrics negative OB ROS                             Anesthesia Physical Anesthesia Plan  ASA: 3  Anesthesia Plan: MAC   Post-op Pain Management:    Induction: Intravenous  PONV Risk Score and Plan:   Airway Management Planned: Natural Airway and Nasal Cannula  Additional  Equipment:   Intra-op Plan:   Post-operative Plan:   Informed Consent: I have reviewed the patients History and Physical, chart, labs and discussed the procedure including the risks, benefits and alternatives for the proposed anesthesia with the patient or authorized representative who has indicated his/her understanding and acceptance.     Dental Advisory Given  Plan Discussed with: Anesthesiologist, CRNA and Surgeon  Anesthesia Plan Comments: (Patient consented for risks of anesthesia including but not limited to:  - adverse reactions to medications - damage to eyes, teeth, lips or other oral mucosa - nerve damage due to positioning  - sore throat or hoarseness - Damage to heart, brain, nerves, lungs, other parts of body or loss of life  Patient voiced understanding and assent.)        Anesthesia Quick Evaluation

## 2023-09-10 NOTE — Discharge Instructions (Signed)

## 2023-09-11 ENCOUNTER — Ambulatory Visit: Admitting: Anesthesiology

## 2023-09-11 ENCOUNTER — Encounter
Admission: RE | Disposition: A | Discharge status: Home / Self Care | Payer: Self-pay | Source: Home / Self Care | Attending: Ophthalmology

## 2023-09-11 ENCOUNTER — Encounter: Payer: Self-pay | Admitting: Ophthalmology

## 2023-09-11 ENCOUNTER — Other Ambulatory Visit: Payer: Self-pay

## 2023-09-11 ENCOUNTER — Ambulatory Visit
Admission: RE | Admit: 2023-09-11 | Discharge: 2023-09-11 | Disposition: A | Discharge status: Home / Self Care | Attending: Ophthalmology | Admitting: Ophthalmology

## 2023-09-11 DIAGNOSIS — Z7984 Long term (current) use of oral hypoglycemic drugs: Secondary | ICD-10-CM | POA: Insufficient documentation

## 2023-09-11 DIAGNOSIS — H5703 Miosis: Secondary | ICD-10-CM | POA: Insufficient documentation

## 2023-09-11 DIAGNOSIS — I10 Essential (primary) hypertension: Secondary | ICD-10-CM | POA: Diagnosis not present

## 2023-09-11 DIAGNOSIS — F32A Depression, unspecified: Secondary | ICD-10-CM | POA: Insufficient documentation

## 2023-09-11 DIAGNOSIS — Z7982 Long term (current) use of aspirin: Secondary | ICD-10-CM | POA: Diagnosis not present

## 2023-09-11 DIAGNOSIS — E1136 Type 2 diabetes mellitus with diabetic cataract: Secondary | ICD-10-CM | POA: Diagnosis not present

## 2023-09-11 DIAGNOSIS — Z79899 Other long term (current) drug therapy: Secondary | ICD-10-CM | POA: Insufficient documentation

## 2023-09-11 DIAGNOSIS — I25119 Atherosclerotic heart disease of native coronary artery with unspecified angina pectoris: Secondary | ICD-10-CM | POA: Diagnosis not present

## 2023-09-11 DIAGNOSIS — Z9842 Cataract extraction status, left eye: Secondary | ICD-10-CM | POA: Diagnosis not present

## 2023-09-11 DIAGNOSIS — Z961 Presence of intraocular lens: Secondary | ICD-10-CM | POA: Insufficient documentation

## 2023-09-11 DIAGNOSIS — H2511 Age-related nuclear cataract, right eye: Secondary | ICD-10-CM | POA: Diagnosis present

## 2023-09-11 HISTORY — PX: CATARACT EXTRACTION W/PHACO: SHX586

## 2023-09-11 LAB — GLUCOSE, CAPILLARY: Glucose-Capillary: 88 mg/dL (ref 70–99)

## 2023-09-11 SURGERY — PHACOEMULSIFICATION, CATARACT, WITH IOL INSERTION
Anesthesia: Monitor Anesthesia Care | Site: Eye | Laterality: Right

## 2023-09-11 MED ORDER — TETRACAINE HCL 0.5 % OP SOLN
OPHTHALMIC | Status: DC | PRN
  Administered 2023-09-11: 1 [drp] via OPHTHALMIC

## 2023-09-11 MED ORDER — FENTANYL CITRATE (PF) 100 MCG/2ML IJ SOLN
INTRAMUSCULAR | Status: AC
  Filled 2023-09-11: qty 2

## 2023-09-11 MED ORDER — CEFUROXIME OPHTHALMIC INJECTION 1 MG/0.1 ML
INJECTION | OPHTHALMIC | Status: DC | PRN
  Administered 2023-09-11: 1 mg via INTRACAMERAL

## 2023-09-11 MED ORDER — ARMC OPHTHALMIC DILATING DROPS
OPHTHALMIC | Status: AC
Start: 2023-09-11 — End: ?
  Filled 2023-09-11: qty 0.5

## 2023-09-11 MED ORDER — MIDAZOLAM HCL 2 MG/2ML IJ SOLN
INTRAMUSCULAR | Status: AC
Start: 2023-09-11 — End: ?
  Filled 2023-09-11: qty 2

## 2023-09-11 MED ORDER — TETRACAINE HCL 0.5 % OP SOLN
1.0000 [drp] | OPHTHALMIC | Status: DC | PRN
  Administered 2023-09-11 (×3): 1 [drp] via OPHTHALMIC

## 2023-09-11 MED ORDER — SIGHTPATH DOSE#1 BSS IO SOLN
INTRAOCULAR | Status: DC | PRN
  Administered 2023-09-11: 53 mL via OPHTHALMIC

## 2023-09-11 MED ORDER — ARMC OPHTHALMIC DILATING DROPS
1.0000 | OPHTHALMIC | Status: DC | PRN
  Administered 2023-09-11 (×3): 1 via OPHTHALMIC

## 2023-09-11 MED ORDER — FENTANYL CITRATE (PF) 100 MCG/2ML IJ SOLN
INTRAMUSCULAR | Status: DC | PRN
  Administered 2023-09-11 (×2): 50 ug via INTRAVENOUS

## 2023-09-11 MED ORDER — TETRACAINE HCL 0.5 % OP SOLN
OPHTHALMIC | Status: AC
  Filled 2023-09-11: qty 4

## 2023-09-11 MED ORDER — MIDAZOLAM HCL 2 MG/2ML IJ SOLN
INTRAMUSCULAR | Status: DC | PRN
  Administered 2023-09-11: 2 mg via INTRAVENOUS

## 2023-09-11 MED ORDER — BRIMONIDINE TARTRATE-TIMOLOL 0.2-0.5 % OP SOLN
OPHTHALMIC | Status: DC | PRN
  Administered 2023-09-11: 1 [drp] via OPHTHALMIC

## 2023-09-11 MED ORDER — SIGHTPATH DOSE#1 NA HYALUR & NA CHOND-NA HYALUR IO KIT
PACK | INTRAOCULAR | Status: DC | PRN
  Administered 2023-09-11: 1 via OPHTHALMIC

## 2023-09-11 MED ORDER — LIDOCAINE HCL (PF) 2 % IJ SOLN
INTRAOCULAR | Status: DC | PRN
  Administered 2023-09-11: 2 mL

## 2023-09-11 MED ORDER — SIGHTPATH DOSE#1 BSS IO SOLN
INTRAOCULAR | Status: DC | PRN
  Administered 2023-09-11: 15 mL via INTRAOCULAR

## 2023-09-11 SURGICAL SUPPLY — 12 items
CATARACT SUITE SIGHTPATH (MISCELLANEOUS) ×1 IMPLANT
FEE CATARACT SUITE SIGHTPATH (MISCELLANEOUS) ×1 IMPLANT
GLOVE BIOGEL PI IND STRL 8 (GLOVE) ×1 IMPLANT
GLOVE SURG LX STRL 7.5 STRW (GLOVE) ×1 IMPLANT
GLOVE SURG PROTEXIS BL SZ6.5 (GLOVE) ×1 IMPLANT
GLOVE SURG SYN 6.5 PF PI BL (GLOVE) ×1 IMPLANT
LENS CLAREON VIVITY TORIC 175 ×1 IMPLANT
LENS IOL CLRN VT TRC 3 17.5 IMPLANT
NDL FILTER BLUNT 18X1 1/2 (NEEDLE) ×1 IMPLANT
NEEDLE FILTER BLUNT 18X1 1/2 (NEEDLE) ×1 IMPLANT
RING MALYGIN 7.0 (MISCELLANEOUS) IMPLANT
SYR 3ML LL SCALE MARK (SYRINGE) ×1 IMPLANT

## 2023-09-11 NOTE — Anesthesia Postprocedure Evaluation (Signed)
 Anesthesia Post Note  Patient: Troy Rojas  Procedure(s) Performed: PHACOEMULSIFICATION, CATARACT, WITH IOL INSERTION 5.16 00:31.6 (Right: Eye)  Patient location during evaluation: PACU Anesthesia Type: MAC Level of consciousness: awake and alert Pain management: pain level controlled Vital Signs Assessment: post-procedure vital signs reviewed and stable Respiratory status: spontaneous breathing, nonlabored ventilation, respiratory function stable and patient connected to nasal cannula oxygen Cardiovascular status: stable and blood pressure returned to baseline Postop Assessment: no apparent nausea or vomiting Anesthetic complications: no   No notable events documented.   Last Vitals:  Vitals:   09/11/23 0820 09/11/23 0826  BP: 95/69 105/76  Pulse: 98 95  Resp: 18 16  Temp: (!) 36.4 C 36.5 C  SpO2: 98% 95%    Last Pain:  Vitals:   09/11/23 0826  PainSc: 0-No pain                 Charlynn Salih C Analysa Nutting

## 2023-09-11 NOTE — Op Note (Signed)
 LOCATION:  [facility]   PREOPERATIVE DIAGNOSIS:  Nuclear sclerotic cataract of the right eye.  H25.11   POSTOPERATIVE DIAGNOSIS:  Nuclear sclerotic cataract of the right eye with miotic pupil   PROCEDURE:  Phacoemulsification with Toric posterior chamber intraocular lens placement of the right eye with Malyugin ring   Ultrasound time: Procedure(s): PHACOEMULSIFICATION, CATARACT, WITH IOL INSERTION 5.16 00:31.6 (Right)  LENS:   Implant Name Type Inv. Item Serial No. Manufacturer Lot No. LRB No. Used Action  LENS [employer] VIVITY TORIC 175 - [id number]  LENS CLAREON VIVITY TORIC 175 [id number] SIGHT[employer]  Right 1 Implanted     CNWET3 17.5 Toric intraocular lens with 1.5 diopters of cylindrical power with axis orientation at 45 degrees.   SURGEON:  Berline Brenner, MD   ANESTHESIA: Topical with tetracaine  drops and 2% Xylocaine  jelly, augmented with 1% preservative-free intracameral lidocaine . .   COMPLICATIONS:  None.   DESCRIPTION OF PROCEDURE:  The patient was identified in the holding room and transported to the operating suite and placed in the supine position under the operating microscope.  The right eye was identified as the operative eye, and it was prepped and draped in the usual sterile ophthalmic fashion.    A clear-corneal paracentesis incision was made at the 12:00 position.  0.5 ml of preservative-free 1% lidocaine  was injected into the anterior chamber. The anterior chamber was filled with Viscoat.  A 2.4 millimeter near clear corneal incision was then made at the 9:00 position. A 7mm Malyugin ring was placed to enlarge the pupil. A cystotome and capsulorrhexis forceps were then used to make a curvilinear capsulorrhexis.  Hydrodissection and hydrodelineation were then performed using balanced salt  solution.   Phacoemulsification was then used in stop and chop fashion to remove the lens, nucleus and epinucleus.  The remaining cortex was aspirated using the  irrigation and aspiration handpiece.  Provisc viscoelastic was then placed into the capsular bag to distend it for lens placement.  The Verion digital marker was used to align the implant at the intended axis.   A Toric lens was then injected into the capsular bag.  It was rotated clockwise until the axis marks on the lens were approximately 15 degrees in the counterclockwise direction to the intended alignment. The Malyugin ring was removed. The viscoelastic was aspirated from the eye using the irrigation aspiration handpiece.  Then, a Koch spatula through the sideport incision was used to rotate the lens in a clockwise direction until the axis markings of the intraocular lens were lined up with the Verion alignment.  Balanced salt  solution was then used to hydrate the wounds. Cefuroxime  0.1 ml of a 10mg /ml solution was injected into the anterior chamber for a dose of 1 mg of intracameral antibiotic at the completion of the case.    The eye was noted to have a physiologic pressure and there was no wound leak noted.   Timolol  and Brimonidine  drops were applied to the eye.  The patient was taken to the recovery room in stable condition having had no complications of anesthesia or surgery.  Troy Rojas 09/11/2023, 8:20 AM

## 2023-09-11 NOTE — H&P (Signed)
 [facility]   Primary Care Physician:  Lyle San, MD Ophthalmologist: Dr. Annell Kidney  Pre-Procedure History & Physical: HPI:  Troy Rojas is a 73 y.o. male here for ophthalmic surgery.   Past Medical History:  Diagnosis Date   Aortic root dilatation (HCC)    a. 06/2018 Echo: Mild dil of Ao root - asc Ao 37mm.   BPH (benign prostatic hyperplasia)    CAD (coronary artery disease)    a. 06/2018 NSTEMI/Cath: LM nl, LAD 60ost/m, D1 100 CTO, D2 40, LCX 60p/m, 59m, RCA small, 65m. Med Rx.   Chronic pain syndrome    Diabetes mellitus without complication (HCC)    Diverticulosis    Dysuria    History of kidney stones    HLD (hyperlipidemia)    Hx of gout    Hydronephrosis    Hypertension    Hypothyroidism    Hypothyroidism    Insomnia    Ischemic cardiomyopathy    a. 06/2018 Echo: EF 55-60%, sev inf HK.   Marginal ulcers    Neuromuscular disorder (HCC)    Nocturia    NSTEMI (non-ST elevated myocardial infarction) (HCC)    Over weight    Prostatitis    Restless leg    Shoulder injury    right - thinks torn ligament or rotator cuff   Testicular hypofunction    Ulcer    Urinary frequency    Wears dentures    partial upper    Past Surgical History:  Procedure Laterality Date   APPENDECTOMY     CATARACT EXTRACTION W/PHACO Left 08/28/2023   Procedure: PHACOEMULSIFICATION, CATARACT, WITH IOL INSERTION 6.59 00:45.5;  Surgeon: Annell Kidney, MD;  Location: [facility];  Service: Ophthalmology;  Laterality: Left;   HEMORRHOID SURGERY     LEFT HEART CATH AND CORONARY ANGIOGRAPHY N/A 07/09/2018   Procedure: LEFT HEART CATH AND CORONARY ANGIOGRAPHY;  Surgeon: Sammy Crisp, MD;  Location: [facility] INVASIVE CV [facility];  Service: Cardiovascular;  Laterality: N/A;   LITHOTRIPSY     x 3   NASAL SINUS SURGERY     STOMACH SURGERY     VASECTOMY      Prior to Admission medications   Medication Sig Start Date End Date Taking? Authorizing Provider   allopurinol  (ZYLOPRIM ) 300 MG tablet Take 1 tablet by mouth daily. 11/14/15  Yes [provider]  amLODipine  (NORVASC ) 10 MG tablet TAKE 1 TABLET BY MOUTH  DAILY 08/03/21  Yes End, Veryl Gottron, MD  aspirin  EC 81 MG tablet Take 81 mg by mouth daily. Swallow whole.   Yes [provider]  atorvastatin  (LIPITOR) 40 MG tablet TAKE 1 TABLET BY MOUTH  DAILY AT 6 PM 08/03/21  Yes End, Veryl Gottron, MD  carvedilol  (COREG ) 6.25 MG tablet TAKE 1 TABLET BY MOUTH TWICE  DAILY WITH MEALS Patient taking differently: Take 3.125 mg by mouth 2 (two) times daily with a meal. 05/24/23  Yes Furth, Cadence H, PA-C  cetirizine (ZYRTEC) 5 MG tablet Take 10 mg by mouth daily as needed.    Yes [provider]  cyclobenzaprine (FLEXERIL) 5 MG tablet Take 5 mg by mouth at bedtime as needed. 11/29/20  Yes [provider]  fenofibrate  micronized (LOFIBRA) 134 MG capsule Take 1 capsule by mouth at bedtime. 11/14/15  Yes [provider]  finasteride  (PROSCAR ) 5 MG tablet Take 1 tablet (5 mg total) by mouth daily. 01/25/15  Yes McGowan, Cathleen Coach A, PA-C  fluticasone  (FLONASE ) 50 MCG/ACT nasal spray Place 2 sprays into both  nostrils daily as needed for allergies or rhinitis.    Yes [provider]  glipiZIDE (GLUCOTROL XL) 2.5 MG 24 hr tablet Take 5 mg by mouth daily. 09/24/19  Yes [provider]  hydrocortisone-pramoxine [facility]) 2.5-1 % rectal cream Place 1 Application rectally 3 (three) times daily.   Yes [provider]  levothyroxine  (SYNTHROID ) 137 MCG tablet Take 150 mcg by mouth daily. 1 tab daily 6 days per week.  On 7th day take 1/2 tab 01/21/21  Yes [provider]  loratadine -pseudoephedrine (CLARITIN -D 24-HOUR) 10-240 MG 24 hr tablet Take 1 tablet by mouth daily as needed for allergies.    Yes [provider]  nortriptyline  (PAMELOR ) 25 MG capsule Take 25 mg by mouth at bedtime.   Yes [provider]  Oxycodone  HCl 10 MG TABS Take  10 tablets by mouth 2 (two) times daily.   Yes [provider]  pantoprazole  (PROTONIX ) 40 MG tablet Take 40 mg by mouth daily.   Yes [provider]  pioglitazone-metformin (ACTOPLUS MET) 15-500 MG tablet Take 1 tablet by mouth 2 (two) times daily. 12/29/20  Yes [provider]  potassium chloride SA (KLOR-CON M) 20 MEQ tablet Take 20 mEq by mouth 2 (two) times daily. 06/13/22  Yes [provider]  venlafaxine XR (EFFEXOR-XR) 150 MG 24 hr capsule Take 150 mg by mouth daily.   Yes [provider]  zolpidem  (AMBIEN ) 10 MG tablet Take 15 mg by mouth at bedtime as needed for sleep.   Yes [provider]  diphenoxylate-atropine (LOMOTIL) 2.5-0.025 MG tablet Take by mouth daily as needed for diarrhea or loose stools.    [provider]  gabapentin  (NEURONTIN ) 600 MG tablet Take 900 mg by mouth 2 (two) times daily. Patient not taking: Reported on 08/21/2023    [provider]  Lancets [facility] DELICA PLUS [place]) MISC USE 3 TIMES DAILY AS INSTRUCTED 03/10/19   [provider]  nitroGLYCERIN  (NITROSTAT ) 0.4 MG SL tablet Place 1 tablet (0.4 mg total) under the tongue every 5 (five) minutes as needed for chest pain. 07/09/18   Lule, Joana, PA  omega-3 acid ethyl esters (LOVAZA ) 1 g capsule Take 2 g by mouth 2 (two) times daily. Patient not taking: Reported on 08/21/2023    [provider]  [facility] ULTRA test strip daily. 03/10/19   [provider]    Allergies as of 08/14/2023 - Review Complete 08/07/2023  Allergen Reaction Noted   Benazepril Other (See Comments) 05/04/2016   Amitriptyline Other (See Comments) 12/20/2014   Duloxetine hcl Other (See Comments) 12/20/2014   Aripiprazole  08/13/2022   Pollen extract Other (See Comments) 12/20/2014    Family History  Problem Relation Age of Onset   Kidney disease Mother    Diabetes type II Mother    Heart disease Mother    Prostate cancer Neg Hx      Social History   Socioeconomic History   Marital status: Divorced    Spouse name: Not on file   Number of children: Not on file   Years of education: Not on file   Highest education level: Not on file  Occupational History   Not on file  Tobacco Use   Smoking status: Never   Smokeless tobacco: Never  Vaping Use   Vaping status: Never Used  Substance and Sexual Activity   Alcohol use: No   Drug use: No   Sexual activity: Not on file  Other Topics Concern   Not on  file  Social History Narrative   Not on file   Social Drivers of Health   Financial Resource Strain: Low Risk  (08/13/2023)   Received from [facility]   Overall Financial Resource Strain (CARDIA)    Difficulty of Paying Living Expenses: Not hard at all  Food Insecurity: No Food Insecurity (08/13/2023)   [facility] from [facility]   Hunger Vital Sign    Worried About Running Out of Food in the Last Year: Never true    Ran Out of Food in the Last Year: Never true  Transportation Needs: No Transportation Needs (08/13/2023)   Received from [facility] - Transportation    In the past 12 months, has lack of transportation kept you from medical appointments or from getting medications?: No    Lack of Transportation (Non-Medical): No  Physical Activity: Not on file  Stress: Not on file  Social Connections: Not on file  Intimate Partner Violence: Not on file    Review of Systems: See HPI, otherwise negative ROS  Physical Exam: BP 109/82   Pulse (!) 102   Temp (!) 97.4 F (36.3 C)   Wt 100.8 kg   SpO2 98%   BMI 35.86 kg/m  General:   Alert,  pleasant and cooperative in NAD Head:  Normocephalic and atraumatic. Lungs:  Clear to auscultation.    Heart:  Regular rate and rhythm.   Impression/Plan: Troy Rojas is here for ophthalmic surgery.  Risks, benefits, limitations, and alternatives regarding ophthalmic surgery have been reviewed  with the patient.  Questions have been answered.  All parties agreeable.   Annell Kidney, MD  09/11/2023, 7:32 AM

## 2023-09-11 NOTE — Transfer of Care (Signed)
 Immediate Anesthesia Transfer of Care Note  Patient: ANOOP HEMMER  Procedure(s) Performed: PHACOEMULSIFICATION, CATARACT, WITH IOL INSERTION 5.16 00:31.6 (Right: Eye)  Patient Location: PACU  Anesthesia Type: MAC  Level of Consciousness: awake, alert  and patient cooperative  Airway and Oxygen Therapy: Patient Spontanous Breathing and Patient connected to supplemental oxygen  Post-op Assessment: Post-op Vital signs reviewed, Patient's Cardiovascular Status Stable, Respiratory Function Stable, Patent Airway and No signs of Nausea or vomiting  Post-op Vital Signs: Reviewed and stable  Complications: No notable events documented.

## 2023-09-12 ENCOUNTER — Encounter: Payer: Self-pay | Admitting: Ophthalmology

## 2023-10-25 ENCOUNTER — Encounter: Payer: Self-pay | Admitting: Internal Medicine

## 2023-11-06 ENCOUNTER — Ambulatory Visit: Admitting: Podiatry

## 2023-12-04 ENCOUNTER — Ambulatory Visit: Admitting: Podiatry

## 2023-12-04 DIAGNOSIS — M722 Plantar fascial fibromatosis: Secondary | ICD-10-CM

## 2023-12-04 NOTE — Progress Notes (Signed)
 He presents today for follow-up of his plantar fasciitis.  States think edema shots again.  Objective: Vital signs are stable alert oriented x 3 pain on palpation to calcaneal tubercles though not as sensitive as they have been previously.  Assessment: Chronic intractable plantar fasciitis bilaterally.  Plan: Reinjected 10 mg Kenalog 5 mg Marcaine point maximal tenderness bilateral heels.  Tolerated procedure well I will follow-up with him on that 3 months basis.

## 2023-12-05 DIAGNOSIS — M722 Plantar fascial fibromatosis: Secondary | ICD-10-CM | POA: Diagnosis not present

## 2023-12-05 MED ORDER — TRIAMCINOLONE ACETONIDE 40 MG/ML IJ SUSP
40.0000 mg | Freq: Once | INTRAMUSCULAR | Status: AC
  Administered 2023-12-05: 40 mg

## 2023-12-05 NOTE — Addendum Note (Signed)
 Addended by: ELAYNE ROSINA BRAVO on: 12/05/2023 07:33 PM   Modules accepted: Orders, Level of Service

## 2024-03-04 ENCOUNTER — Ambulatory Visit: Admitting: Podiatry

## 2024-03-23 ENCOUNTER — Ambulatory Visit: Admitting: Podiatry

## 2024-03-23 DIAGNOSIS — M722 Plantar fascial fibromatosis: Secondary | ICD-10-CM

## 2024-03-23 MED ORDER — TRIAMCINOLONE ACETONIDE 40 MG/ML IJ SUSP
20.0000 mg | Freq: Once | INTRAMUSCULAR | Status: AC
  Administered 2024-03-23: 20 mg

## 2024-03-23 NOTE — Progress Notes (Signed)
 He presents today for follow-up of his plantar fasciitis.  States think edema shots again.  Objective: Vital signs are stable alert oriented x 3 pain on palpation to calcaneal tubercles though not as sensitive as they have been previously.  Assessment: Chronic intractable plantar fasciitis bilaterally.  Plan: Reinjected 10 mg Kenalog 5 mg Marcaine point maximal tenderness bilateral heels.  Tolerated procedure well I will follow-up with him on that 3 months basis.

## 2024-05-19 ENCOUNTER — Other Ambulatory Visit: Payer: Self-pay | Admitting: Medical

## 2024-07-15 ENCOUNTER — Ambulatory Visit: Admitting: Podiatry
